# Patient Record
Sex: Female | Born: 1960 | Race: Black or African American | Hispanic: No | Marital: Single | Smoking: Former smoker
Health system: Southern US, Community
[De-identification: ages and names within clinical notes are randomized; demographics above are authoritative.]

## PROBLEM LIST (undated history)

## (undated) DIAGNOSIS — H409 Unspecified glaucoma: Secondary | ICD-10-CM

## (undated) HISTORY — PX: COLONOSCOPY: SHX174

## (undated) HISTORY — DX: Unspecified glaucoma: H40.9

## (undated) HISTORY — PX: POLYPECTOMY: SHX149

---

## 1998-01-31 ENCOUNTER — Other Ambulatory Visit: Admission: RE | Admit: 1998-01-31 | Discharge: 1998-01-31 | Payer: Self-pay | Admitting: Obstetrics and Gynecology

## 1998-01-31 ENCOUNTER — Ambulatory Visit (HOSPITAL_COMMUNITY): Admission: RE | Admit: 1998-01-31 | Discharge: 1998-01-31 | Payer: Self-pay | Admitting: Dermatology

## 1998-04-01 ENCOUNTER — Ambulatory Visit (HOSPITAL_COMMUNITY): Admission: RE | Admit: 1998-04-01 | Discharge: 1998-04-01 | Payer: Self-pay | Admitting: Obstetrics and Gynecology

## 1999-04-14 ENCOUNTER — Other Ambulatory Visit: Admission: RE | Admit: 1999-04-14 | Discharge: 1999-04-14 | Payer: Self-pay | Admitting: Obstetrics and Gynecology

## 2000-05-28 ENCOUNTER — Other Ambulatory Visit: Admission: RE | Admit: 2000-05-28 | Discharge: 2000-05-28 | Payer: Self-pay | Admitting: Obstetrics and Gynecology

## 2001-04-22 ENCOUNTER — Encounter: Admission: RE | Admit: 2001-04-22 | Discharge: 2001-04-22 | Payer: Self-pay | Admitting: Gastroenterology

## 2001-04-22 ENCOUNTER — Encounter: Payer: Self-pay | Admitting: Gastroenterology

## 2001-05-10 ENCOUNTER — Emergency Department (HOSPITAL_COMMUNITY): Admission: EM | Admit: 2001-05-10 | Discharge: 2001-05-11 | Payer: Self-pay

## 2001-05-11 ENCOUNTER — Encounter: Payer: Self-pay | Admitting: Emergency Medicine

## 2001-05-26 ENCOUNTER — Other Ambulatory Visit: Admission: RE | Admit: 2001-05-26 | Discharge: 2001-05-26 | Payer: Self-pay | Admitting: Obstetrics and Gynecology

## 2001-07-01 ENCOUNTER — Encounter: Payer: Self-pay | Admitting: Pulmonary Disease

## 2001-07-01 ENCOUNTER — Ambulatory Visit: Admission: RE | Admit: 2001-07-01 | Discharge: 2001-07-01 | Payer: Self-pay | Admitting: Pulmonary Disease

## 2002-01-30 ENCOUNTER — Encounter: Admission: RE | Admit: 2002-01-30 | Discharge: 2002-01-30 | Payer: Self-pay | Admitting: Gastroenterology

## 2002-01-30 ENCOUNTER — Encounter: Payer: Self-pay | Admitting: Gastroenterology

## 2002-07-06 ENCOUNTER — Encounter: Admission: RE | Admit: 2002-07-06 | Discharge: 2002-07-06 | Payer: Self-pay | Admitting: Internal Medicine

## 2002-07-06 ENCOUNTER — Encounter: Payer: Self-pay | Admitting: Obstetrics and Gynecology

## 2003-06-09 ENCOUNTER — Encounter: Admission: RE | Admit: 2003-06-09 | Discharge: 2003-06-09 | Payer: Self-pay | Admitting: Obstetrics and Gynecology

## 2003-06-09 ENCOUNTER — Encounter: Payer: Self-pay | Admitting: Obstetrics and Gynecology

## 2004-08-21 ENCOUNTER — Ambulatory Visit (HOSPITAL_COMMUNITY): Admission: RE | Admit: 2004-08-21 | Discharge: 2004-08-21 | Payer: Self-pay | Admitting: Obstetrics and Gynecology

## 2005-09-07 ENCOUNTER — Encounter: Payer: Self-pay | Admitting: Family Medicine

## 2005-09-07 LAB — CONVERTED CEMR LAB: Pap Smear: NORMAL

## 2005-11-08 HISTORY — PX: GANGLION CYST EXCISION: SHX1691

## 2005-11-21 ENCOUNTER — Ambulatory Visit (HOSPITAL_COMMUNITY): Admission: RE | Admit: 2005-11-21 | Discharge: 2005-11-21 | Payer: Self-pay | Admitting: Obstetrics and Gynecology

## 2006-08-02 ENCOUNTER — Ambulatory Visit: Payer: Self-pay | Admitting: Family Medicine

## 2006-08-03 ENCOUNTER — Emergency Department (HOSPITAL_COMMUNITY): Admission: EM | Admit: 2006-08-03 | Discharge: 2006-08-03 | Payer: Self-pay | Admitting: Emergency Medicine

## 2006-08-12 ENCOUNTER — Encounter
Admission: RE | Admit: 2006-08-12 | Discharge: 2006-08-12 | Payer: Self-pay | Admitting: Physical Medicine and Rehabilitation

## 2006-08-12 IMAGING — US US ABDOMEN LIMITED
1 series · 14 of 14 positions shown · non-contrast
Comparison: none

CLINICAL DATA: Follow-up liver lesion MRI [REDACTED] [DATE] and [REDACTED] ultrasound [DATE] and [DATE].
LIMITED ABDOMINAL ULTRASOUND:

[Series 1: unknown · 0.23mm/px · 14 of 14 slices shown]
[im 1/14]
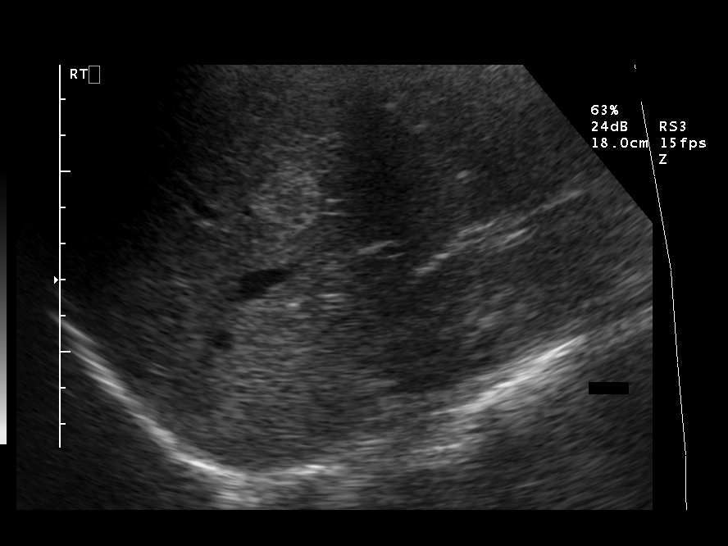
[im 2/14]
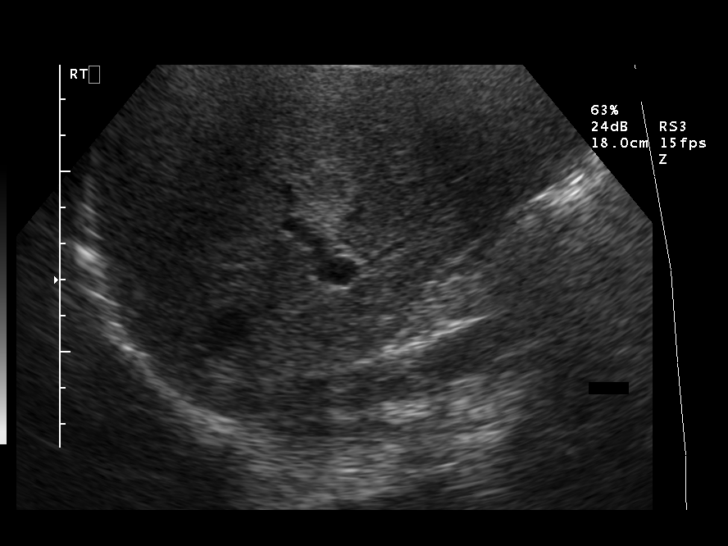
[im 3/14]
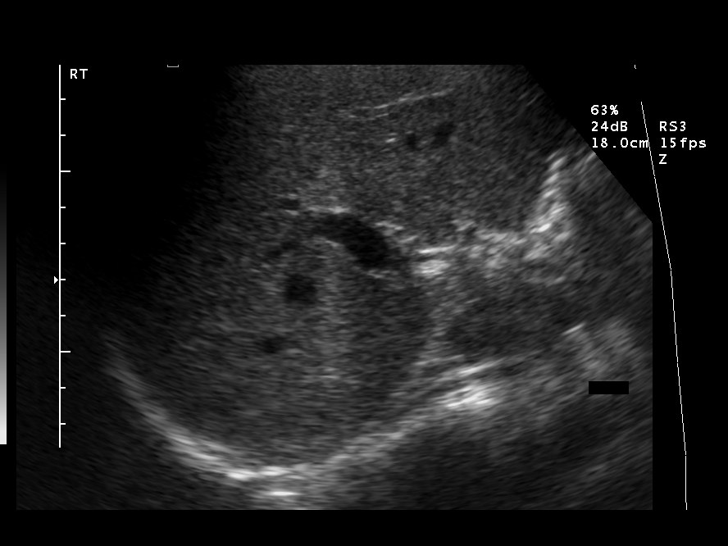
[im 4/14]
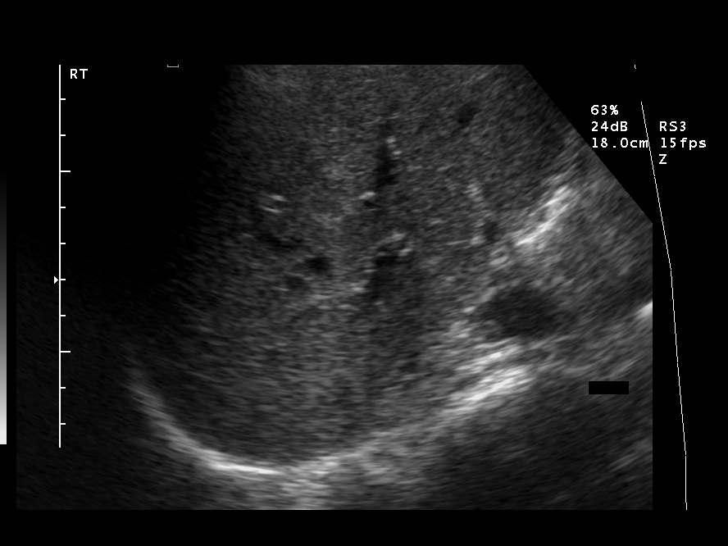
[im 5/14]
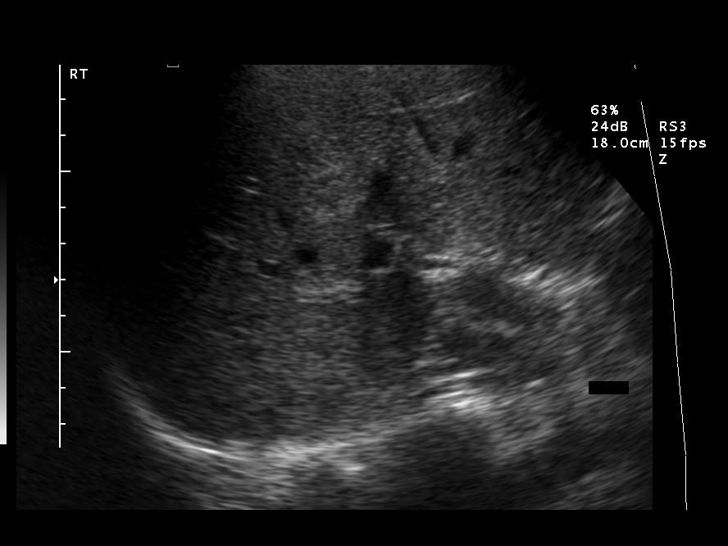
[im 6/14]
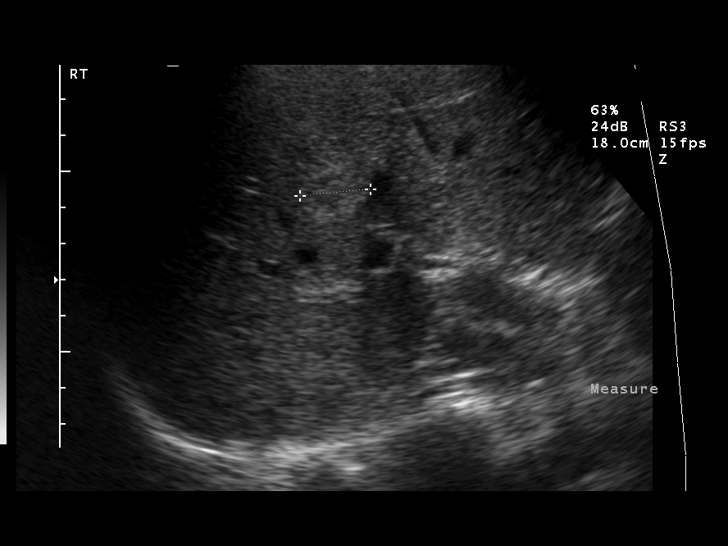
[im 7/14]
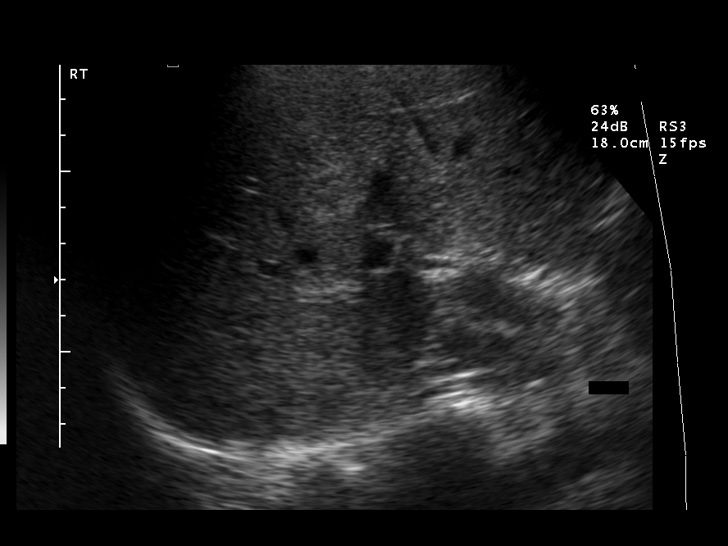
[im 8/14]
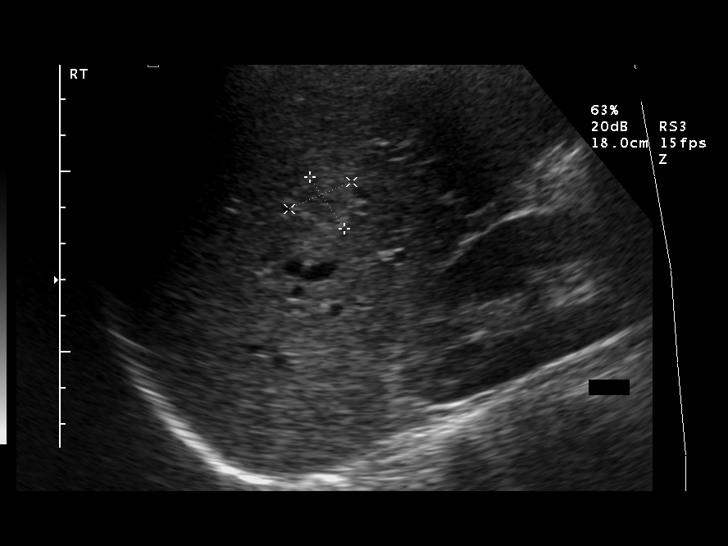
[im 9/14]
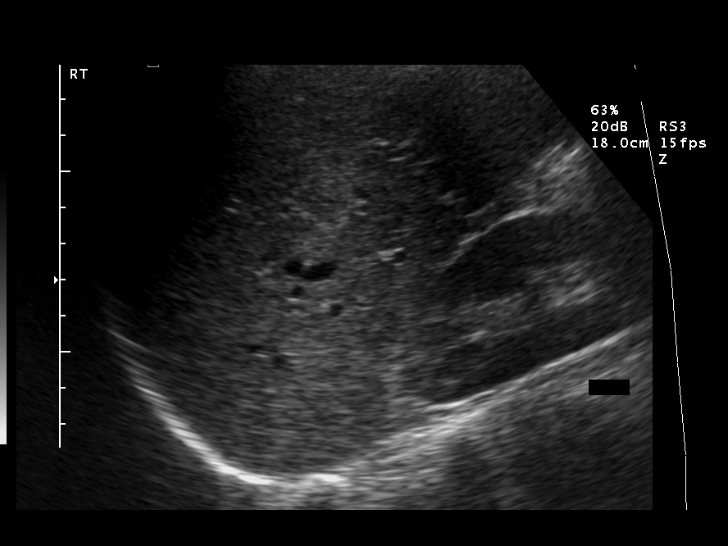
[im 10/14]
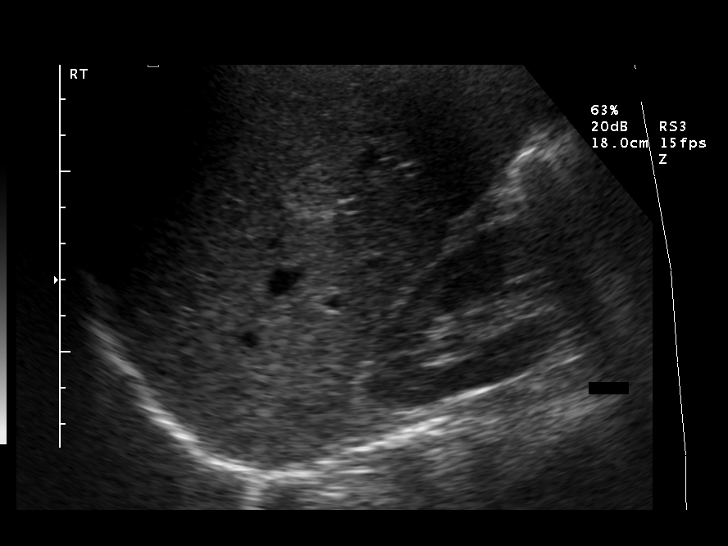
[im 11/14]
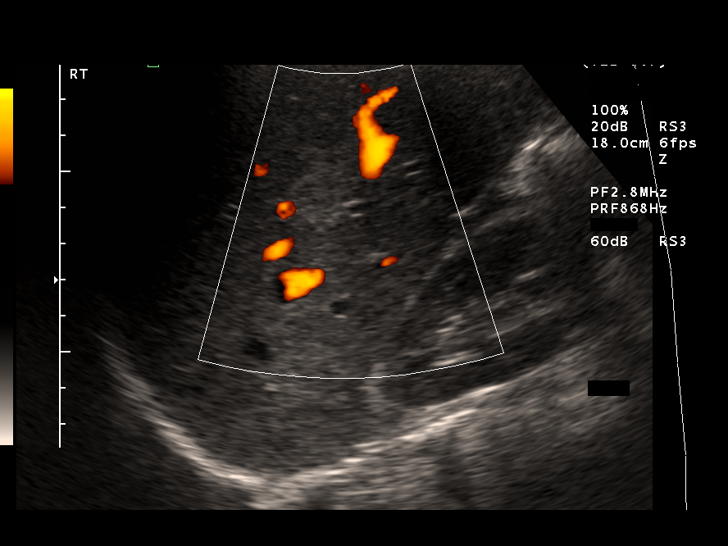
[im 12/14]
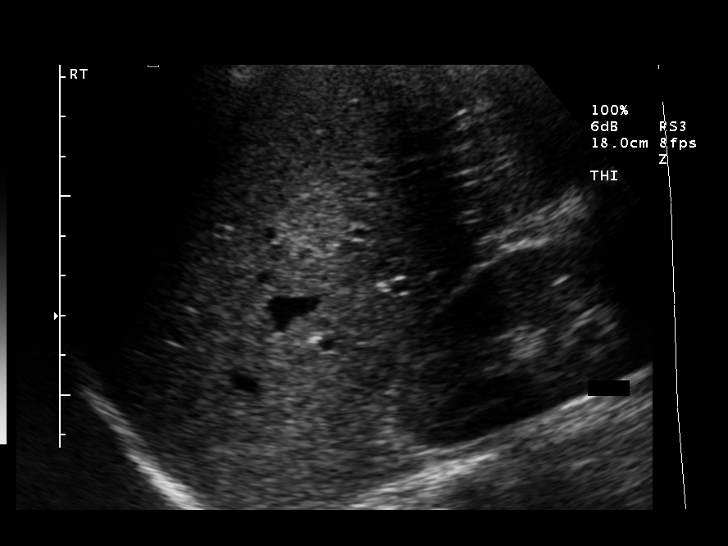
[im 13/14]
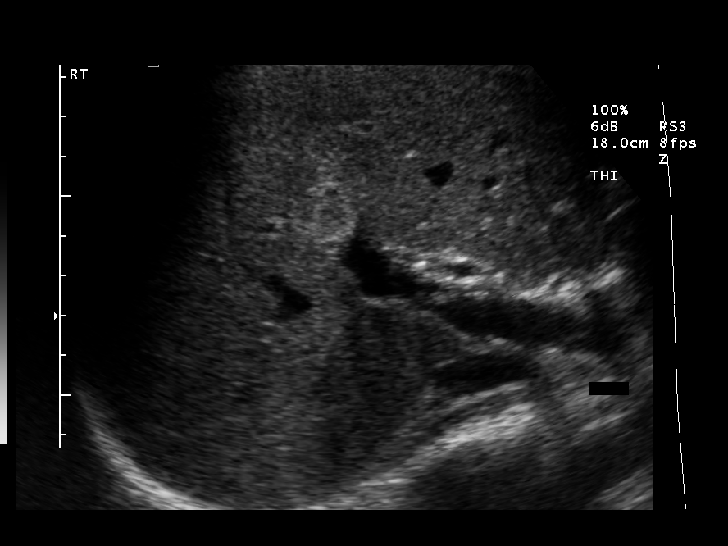
[im 14/14]
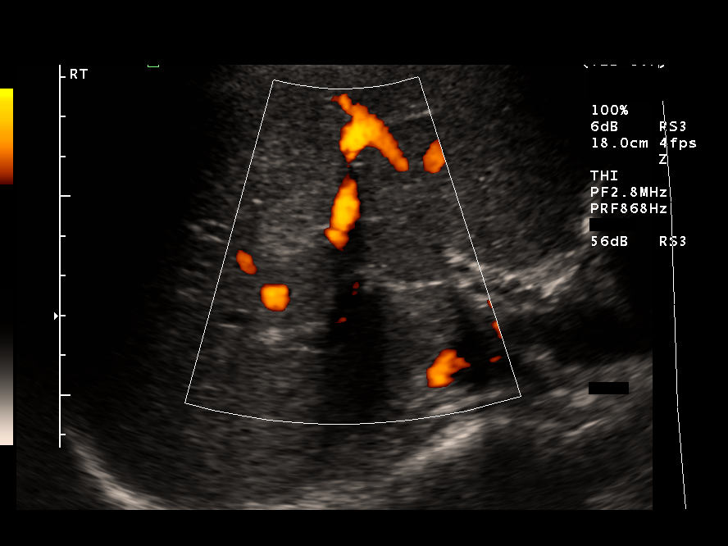

[14 of 14 positions shown; findings below may reference images not displayed]

FINDINGS: Targeted liver ultrasound is compared with previous [REDACTED] abdominal ultrasound reports of [DATE] and [DATE].  No significant change in solitary slightly hyperechoic marginated right hepatic lesion consistent with hemangioma, currently measuring 1.7 cm long by 1.9 cm AP by 2 cm wide (previously reported [DATE] 1.4 x 1.4 x 1.1 cm and [DATE] 1.6 x 1.4 x 1.2 cm).  No new focal solid liver lesions are seen.
IMPRESSION: 1.  Essentially stable solitary lesion consistent with hemangioma.
2.  Otherwise, sonographically normal liver.

## 2006-08-22 ENCOUNTER — Ambulatory Visit: Payer: Self-pay | Admitting: Family Medicine

## 2006-08-28 ENCOUNTER — Ambulatory Visit: Payer: Self-pay | Admitting: Cardiology

## 2006-09-10 ENCOUNTER — Ambulatory Visit: Payer: Self-pay | Admitting: Gastroenterology

## 2006-09-25 ENCOUNTER — Encounter (INDEPENDENT_AMBULATORY_CARE_PROVIDER_SITE_OTHER): Payer: Self-pay | Admitting: Specialist

## 2006-09-25 ENCOUNTER — Ambulatory Visit: Payer: Self-pay | Admitting: Gastroenterology

## 2007-03-17 ENCOUNTER — Encounter: Admission: RE | Admit: 2007-03-17 | Discharge: 2007-03-17 | Payer: Self-pay | Admitting: Obstetrics and Gynecology

## 2007-10-23 ENCOUNTER — Ambulatory Visit: Payer: Self-pay | Admitting: Family Medicine

## 2007-10-24 ENCOUNTER — Encounter: Payer: Self-pay | Admitting: Family Medicine

## 2007-10-24 DIAGNOSIS — F17200 Nicotine dependence, unspecified, uncomplicated: Secondary | ICD-10-CM

## 2007-10-24 DIAGNOSIS — K219 Gastro-esophageal reflux disease without esophagitis: Secondary | ICD-10-CM

## 2007-10-24 DIAGNOSIS — E785 Hyperlipidemia, unspecified: Secondary | ICD-10-CM | POA: Insufficient documentation

## 2007-10-24 HISTORY — DX: Hyperlipidemia, unspecified: E78.5

## 2007-10-29 ENCOUNTER — Telehealth: Payer: Self-pay | Admitting: Family Medicine

## 2007-11-05 ENCOUNTER — Ambulatory Visit: Payer: Self-pay | Admitting: Internal Medicine

## 2007-11-05 LAB — CONVERTED CEMR LAB
Blood in Urine, dipstick: NEGATIVE
Glucose, Urine, Semiquant: NEGATIVE
Ketones, urine, test strip: NEGATIVE
Specific Gravity, Urine: <1.005

## 2007-11-07 LAB — CONVERTED CEMR LAB
CO2: 26 meq/L (ref 19–32)
Calcium: 9.3 mg/dL (ref 8.4–10.5)
Chloride: 107 meq/L (ref 96–112)
Eosinophils Absolute: 0.1 10*3/uL (ref 0.0–0.6)
GFR calc Af Amer: 87 mL/min
GFR calc non Af Amer: 72 mL/min
Glucose, Bld: 100 mg/dL — ABNORMAL HIGH (ref 70–99)
Hemoglobin: 11.6 g/dL — ABNORMAL LOW (ref 12.0–15.0)
Lymphocytes Relative: 33.3 % (ref 12.0–46.0)
Monocytes Relative: 3 % (ref 3.0–11.0)
Neutrophils Relative %: 61.6 % (ref 43.0–77.0)
RDW: 11.9 % (ref 11.5–14.6)
Sodium: 139 meq/L (ref 135–145)
TSH: 1.27 microintl units/mL (ref 0.35–5.50)
WBC: 6.7 10*3/uL (ref 4.5–10.5)

## 2008-01-29 ENCOUNTER — Ambulatory Visit: Payer: Self-pay | Admitting: Family Medicine

## 2008-04-30 ENCOUNTER — Ambulatory Visit: Payer: Self-pay | Admitting: Family Medicine

## 2008-04-30 LAB — CONVERTED CEMR LAB

## 2008-05-03 ENCOUNTER — Telehealth: Payer: Self-pay | Admitting: Family Medicine

## 2008-05-03 LAB — CONVERTED CEMR LAB
ALT: 15 units/L (ref 0–35)
AST: 17 units/L (ref 0–37)
Albumin: 4.1 g/dL (ref 3.5–5.2)
Basophils Relative: 0.9 % (ref 0.0–3.0)
CO2: 25 meq/L (ref 19–32)
Calcium: 9.3 mg/dL (ref 8.4–10.5)
Eosinophils Relative: 2.1 % (ref 0.0–5.0)
Folate: 11.1 ng/mL
GFR calc Af Amer: 99 mL/min
HCT: 36.5 % (ref 36.0–46.0)
Lymphocytes Relative: 33.4 % (ref 12.0–46.0)
Monocytes Absolute: 0.3 10*3/uL (ref 0.1–1.0)
Monocytes Relative: 5.4 % (ref 3.0–12.0)
RBC: 4.01 M/uL (ref 3.87–5.11)
RDW: 12.7 % (ref 11.5–14.6)
Total Bilirubin: 0.8 mg/dL (ref 0.3–1.2)
Vitamin B-12: 191 pg/mL — ABNORMAL LOW (ref 211–911)
WBC: 5.7 10*3/uL (ref 4.5–10.5)

## 2008-05-05 ENCOUNTER — Ambulatory Visit (HOSPITAL_COMMUNITY): Admission: RE | Admit: 2008-05-05 | Discharge: 2008-05-05 | Payer: Self-pay | Admitting: Obstetrics and Gynecology

## 2008-05-05 ENCOUNTER — Ambulatory Visit: Payer: Self-pay | Admitting: Family Medicine

## 2008-05-23 ENCOUNTER — Emergency Department: Payer: Self-pay | Admitting: Emergency Medicine

## 2008-06-04 ENCOUNTER — Ambulatory Visit: Payer: Self-pay | Admitting: Family Medicine

## 2008-06-07 ENCOUNTER — Encounter: Payer: Self-pay | Admitting: Family Medicine

## 2008-08-10 ENCOUNTER — Ambulatory Visit: Payer: Self-pay | Admitting: Family Medicine

## 2008-08-11 ENCOUNTER — Encounter: Payer: Self-pay | Admitting: Family Medicine

## 2008-08-13 ENCOUNTER — Telehealth: Payer: Self-pay | Admitting: Family Medicine

## 2008-09-16 ENCOUNTER — Encounter: Payer: Self-pay | Admitting: Family Medicine

## 2008-10-08 ENCOUNTER — Encounter: Payer: Self-pay | Admitting: Family Medicine

## 2009-06-07 ENCOUNTER — Ambulatory Visit (HOSPITAL_COMMUNITY): Admission: RE | Admit: 2009-06-07 | Discharge: 2009-06-07 | Payer: Self-pay | Admitting: Obstetrics and Gynecology

## 2009-06-09 ENCOUNTER — Encounter (INDEPENDENT_AMBULATORY_CARE_PROVIDER_SITE_OTHER): Payer: Self-pay | Admitting: *Deleted

## 2009-08-08 ENCOUNTER — Encounter: Payer: Self-pay | Admitting: Family Medicine

## 2009-08-08 LAB — CONVERTED CEMR LAB: Pap Smear: NORMAL

## 2009-08-08 LAB — HM PAP SMEAR

## 2010-06-16 ENCOUNTER — Ambulatory Visit (HOSPITAL_COMMUNITY): Admission: RE | Admit: 2010-06-16 | Discharge: 2010-06-16 | Payer: Self-pay | Admitting: Obstetrics and Gynecology

## 2010-07-26 ENCOUNTER — Telehealth: Payer: Self-pay | Admitting: Family Medicine

## 2010-10-30 ENCOUNTER — Ambulatory Visit
Admission: RE | Admit: 2010-10-30 | Discharge: 2010-10-30 | Payer: Self-pay | Source: Home / Self Care | Attending: Family Medicine | Admitting: Family Medicine

## 2010-10-30 ENCOUNTER — Encounter (INDEPENDENT_AMBULATORY_CARE_PROVIDER_SITE_OTHER): Payer: Self-pay | Admitting: *Deleted

## 2010-11-07 NOTE — Progress Notes (Signed)
Summary: BP elevated  Phone Note Call from Patient Call back at [phone]   Caller: Patient Summary of Call: Pt states her BP was 145/85 and 140/85 yesterday and she says it has never been that high, asks if she should be concerned.  She has a tension pain in the back of her head.  She thinks this is due to stress.  She is under a lot of stress at work.   Any suggestions? Initial call taken by: Lowella Petties CMA,  July 26, 2010 9:03 AM  Follow-up for Phone Call        Work on low salt diet, stress reduaion and relaxation. Follow BPs at home..if 3 measurements at different time above 140/90.Marland Kitchenmake appt to discuss new diagnosis HTN.  Follow-up by: Kerby Nora MD,  July 26, 2010 12:46 PM  Additional Follow-up for Phone Call Additional follow up Details #1::        Patient advised.Consuello Masse CMA   Additional Follow-up by: Benny Lennert CMA Duncan Dull),  July 26, 2010 1:23 PM

## 2010-11-09 NOTE — Letter (Signed)
Summary: Out of Work  [employer] at [facility]  [address] [place], [place] [address]   Phone: [phone]  Fax: [phone]    October 30, 2010   Employee:  Maedell E Mittag    To Whom It May Concern:   For Medical reasons, please excuse the above named employee from work for the following dates:  Start:  October 30, 2010 12:22 PM   End:   May return January 24th, 2012  If you need additional information, please feel free to contact our office.         Sincerely,    Hannah Beat MD

## 2010-11-09 NOTE — Assessment & Plan Note (Signed)
Summary: COUGH,CONGESTION/CLE   Vital Signs:  Patient profile:   50 year old female Height:      65 inches Weight:      154.50 pounds BMI:     25.80 Temp:     97.8 degrees F oral Pulse rate:   80 / minute Pulse rhythm:   regular BP sitting:   104 / 70  (left arm) Cuff size:   regular  Vitals Entered By: Benny Lennert CMA Duncan Dull) (October 30, 2010 12:04 PM)  History of Present Illness: Chief complaint cough and  congestion  50 year old female:  Came home wednesday night, hit like her head was full of congestion. Mucinex and zyrtec d When the medicine comes out, a lot of congestion and coughing.  mild drainage no earache eating and drinking normally.  Smokes a few cigarettes a day.    REVIEW OF SYSTEMS GEN: Acute illness details above. CV: No chest pain or SOB GI: No noted N or V Otherwise, pertinent positives and negatives are noted in the HPI.   GEN: WDWN, NAD; alert,appropriate and cooperative throughout exam HEENT: Normocephalic and atraumatic. Throat clear, w/o exudate, no LAD, R TM clear, L TM - good landmarks, No fluid present. rhinnorhea.  Left frontal and maxillary sinuses: NT Right frontal and maxillary sinuses: NT NECK: No ant or post LAD CV: RRR, No M/G/R PULM: no resp distress, no accessory muscles.  No retractions. no w/c/r ABD: S,NT,ND,+BS, No HSM EXTR: no c/c/e PSYCH: full affect, pleasant, conversant   Allergies (verified): No Known Drug Allergies   Impression & Recommendations:  Problem # 1:  URI (ICD-465.9) supportive care albuterol as needed - light cigarette use  Complete Medication List: 1)  Lo/ovral (28) 0.3-30 Mg-mcg Tabs (Norgestrel-ethinyl estradiol) .... Take 1 tablet by mouth once a day 2)  Ventolin Hfa 108 (90 Base) Mcg/act Aers (Albuterol sulfate) .... 2 puffs q 4 hours as needed cough Prescriptions: VENTOLIN HFA 108 (90 BASE) MCG/ACT AERS (ALBUTEROL SULFATE) 2 puffs q 4 hours as needed cough  #1 x 0   Entered and Authorized  by:   Hannah Beat MD   Signed by:   Hannah Beat MD on 10/30/2010   Method used:   [facility]        [facility]       [address]       [place], [place]  [address]       Ph: [phone] or [phone]       Fax: [phone]   RxID:   [id number]    Orders Added: 1)  Est. Patient Level III [38756]    Current Allergies (reviewed today): No known allergies

## 2010-11-20 ENCOUNTER — Telehealth (INDEPENDENT_AMBULATORY_CARE_PROVIDER_SITE_OTHER): Payer: Self-pay | Admitting: *Deleted

## 2010-11-20 ENCOUNTER — Encounter (INDEPENDENT_AMBULATORY_CARE_PROVIDER_SITE_OTHER): Payer: Self-pay | Admitting: *Deleted

## 2010-11-20 ENCOUNTER — Other Ambulatory Visit: Payer: Self-pay | Admitting: Family Medicine

## 2010-11-20 ENCOUNTER — Other Ambulatory Visit (INDEPENDENT_AMBULATORY_CARE_PROVIDER_SITE_OTHER): Payer: Commercial Managed Care - PPO

## 2010-11-20 DIAGNOSIS — E785 Hyperlipidemia, unspecified: Secondary | ICD-10-CM

## 2010-11-20 LAB — HEPATIC FUNCTION PANEL
Albumin: 4.1 g/dL (ref 3.5–5.2)
Alkaline Phosphatase: 53 U/L (ref 39–117)
Total Bilirubin: 0.7 mg/dL (ref 0.3–1.2)
Total Protein: 7.3 g/dL (ref 6.0–8.3)

## 2010-11-20 LAB — BASIC METABOLIC PANEL
CO2: 24 mEq/L (ref 19–32)
Calcium: 9.2 mg/dL (ref 8.4–10.5)
Creatinine, Ser: 0.7 mg/dL (ref 0.4–1.2)
GFR: 108.94 mL/min (ref >60.00)
Glucose, Bld: 83 mg/dL (ref 70–99)
Potassium: 4.1 mEq/L (ref 3.5–5.1)
Sodium: 139 mEq/L (ref 135–145)

## 2010-11-20 LAB — LIPID PANEL: Cholesterol: 215 mg/dL — ABNORMAL HIGH (ref 0–200)

## 2010-11-23 ENCOUNTER — Encounter: Payer: Self-pay | Admitting: Family Medicine

## 2010-11-23 ENCOUNTER — Encounter (INDEPENDENT_AMBULATORY_CARE_PROVIDER_SITE_OTHER): Payer: Commercial Managed Care - PPO | Admitting: Family Medicine

## 2010-11-23 DIAGNOSIS — J4520 Mild intermittent asthma, uncomplicated: Secondary | ICD-10-CM

## 2010-11-23 DIAGNOSIS — E785 Hyperlipidemia, unspecified: Secondary | ICD-10-CM

## 2010-11-23 DIAGNOSIS — K219 Gastro-esophageal reflux disease without esophagitis: Secondary | ICD-10-CM

## 2010-11-23 DIAGNOSIS — Z23 Encounter for immunization: Secondary | ICD-10-CM

## 2010-11-23 DIAGNOSIS — F17200 Nicotine dependence, unspecified, uncomplicated: Secondary | ICD-10-CM

## 2010-11-23 DIAGNOSIS — J45909 Unspecified asthma, uncomplicated: Secondary | ICD-10-CM

## 2010-11-23 HISTORY — DX: Mild intermittent asthma, uncomplicated: J45.20

## 2010-11-29 NOTE — Progress Notes (Signed)
----   Converted from flag ---- ---- 11/20/2010 8:25 AM, Kerby Nora MD wrote: Dx 272.0 CMEt, lipids   ---- 11/17/2010 10:36 AM, Liane Comber CMA (AAMA) wrote: Lab orders please! Good Morning! This pt is scheduled for cpx labs Monday, which labs to draw and dx codes to use? Thanks Tasha ------------------------------

## 2010-11-29 NOTE — Assessment & Plan Note (Signed)
Summary: cpx/alc   Vital Signs:  Patient profile:   50 year old female Height:      65 inches Weight:      149 pounds BMI:     24.88 Temp:     98.6 degrees F oral Pulse rate:   80 / minute Pulse rhythm:   regular BP sitting:   120 / 70  (left arm) Cuff size:   regular  Vitals Entered By: Benny Lennert CMA Duncan Dull) (November 23, 2010 9:16 AM)  History of Present Illness: Chief complaint  Follow up.   CPX done at GYN.   I have not seen her since 2009.Marland Kitchen Acute visit in 2012 with [facility].    Asthma, well controlled on no med. Last problem with this years ago.  GERD, well controlle don no medication.   High cholesterol.. fairly good diet.. some fried foods. No exercsie.  Goal LDL <130  Smoking... contemplative for smoking cessation. Smoking 1 pack in 5 days... closet smoker.   Problems Prior to Update: 1)  Pharyngitis, Acute  (ICD-462) 2)  Memory Loss  (ICD-780.93) 3)  Sinusitis- Acute-nos  (ICD-461.9) 4)  Malaise and Fatigue  (ICD-780.79) 5)  Tobacco Abuse  (ICD-305.1) 6)  Hyperlipidemia  (ICD-272.4) 7)  Gerd  (ICD-530.81) 8)  Asthma  (ICD-493.90) 9)  Uri  (ICD-465.9)  Current Medications (verified): 1)  Lo/ovral (28) 0.3-30 Mg-Mcg Tabs (Norgestrel-Ethinyl Estradiol) .... Take 1 Tablet By Mouth Once A Day  Allergies (verified): No Known Drug Allergies  Past History:  Past medical, surgical, family and social histories (including risk factors) reviewed, and no changes noted (except as noted below).  Past Medical History: Reviewed history from 10/24/2007 and no changes required. Asthma GERD Hyperlipidemia  Past Surgical History: Reviewed history from 10/24/2007 and no changes required. 11/2005    Ganglion cyst, right  Family History: Reviewed history from 10/24/2007 and no changes required. Father: Died 63 CAD, COPD Mother: Died 14, lung CA, colon CA, HTN Siblings: 1 brother, 2 sisters CVA age 74, seizures after procedure at age 22, CAD  Social  History: Reviewed history from 10/24/2007 and no changes required. Current Smoker, 3/day, using gum 2-3/week, not working Alcohol use-none Drug use-no Regular exercise-yes, sporadic Marital Status: Divorced, in a relationship, (+) sex, no protection Children: 1 son, healthy Occupation: [employer] Diet:  Skips  breakfast, salads  Review of Systems General:  Denies fatigue and fever. CV:  Denies chest pain or discomfort. Resp:  Denies shortness of breath. GI:  Denies abdominal pain, bloody stools, constipation, and diarrhea. GU:  Denies dysuria.  Physical Exam  General:  Well-developed,well-nourished,in no acute distress; alert,appropriate and cooperative throughout examination Ears:  External ear exam shows no significant lesions or deformities.  Otoscopic examination reveals clear canals, tympanic membranes are intact bilaterally without bulging, retraction, inflammation or discharge. Hearing is grossly normal bilaterally. Nose:  External nasal examination shows no deformity or inflammation. Nasal mucosa are pink and moist without lesions or exudates. Mouth:  Oral mucosa and oropharynx without lesions or exudates.  Teeth in good repair. Neck:  no carotid bruit or thyromegaly no cervical or supraclavicular lymphadenopathy  Breasts:  per GYN  Lungs:  Normal respiratory effort, chest expands symmetrically. Lungs are clear to auscultation, no crackles or wheezes. Heart:  Normal rate and regular rhythm. S1 and S2 normal without gallop, murmur, click, rub or other extra sounds. Abdomen:  Bowel sounds positive,abdomen soft and non-tender without masses, organomegaly or hernias noted. Genitalia:  per GYN  Pulses:  R and L posterior  tibial pulses are full and equal bilaterally  Extremities:  no edema  Skin:  Intact without suspicious lesions or rashes Psych:  Cognition and judgment appear intact. Alert and cooperative with normal attention span and concentration. No apparent delusions,  illusions, hallucinations   Impression & Recommendations:  Problem # 1:  HYPERLIPIDEMIA (ICD-272.4)  Work on low cholesterol diet. Increase exercise.   Labs Reviewed: SGOT: 17 (11/20/2010)   SGPT: 11 (11/20/2010)   HDL:42.60 (11/20/2010)  Chol:215 (11/20/2010)  Trig:150.0 (11/20/2010)  Problem # 2:  ASTHMA (ICD-493.90) stable on no medication.  The following medications were removed from the medication list:    Ventolin Hfa 108 (90 Base) Mcg/act Aers (Albuterol sulfate) .Marland Kitchen... 2 puffs q 4 hours as needed cough  Problem # 3:  ASTHMA, INTERMITTENT, MILD (ICD-493.90) Stable.. on no medicaiton. No exacerbations in  last few years.   The following medications were removed from the medication list:    Ventolin Hfa 108 (90 Base) Mcg/act Aers (Albuterol sulfate) .Marland Kitchen... 2 puffs q 4 hours as needed cough  Problem # 4:  TOBACCO ABUSE (ICD-305.1)  Orders: Tobacco use cessation intermediate 3-10 minutes ([address])  Problem # 5:  Preventive Health Care (ICD-V70.0) Assessment: Comment Only Reviewed preventive care protocols, scheduled due services, and updated immunizations.    Colonmoscopy due tin 2014  given mothers hx of colon cancer age 85.   Complete Medication List: 1)  Lo/ovral (28) 0.3-30 Mg-mcg Tabs (Norgestrel-ethinyl estradiol) .... Take 1 tablet by mouth once a day 2)  Chantix Starting Month Pak 0.5 Mg X 11 & 1 Mg X 42 Tabs (Varenicline tartrate) .... As directd 3)  Chantix Continuing Month Pak 1 Mg Tabs (Varenicline tartrate) .Marland Kitchen.. 1 tab by mouth two times a day  Other Orders: Tdap => 3yrs IM (60454) Admin 1st Vaccine (09811)  Patient Instructions: 1)  Work on low cholesterol diet. Increase exercise.  2)   Recheck fasting LIPIDS  in 3 months Dx 272.0    3)  Please schedule a follow-up appointment in 1 year.  Prescriptions: CHANTIX CONTINUING MONTH PAK 1 MG TABS (VARENICLINE TARTRATE) 1 tab by mouth two times a day  #1 pack x 5   Entered and Authorized by:   Kerby Nora MD    Signed by:   Kerby Nora MD on 11/23/2010   Method used:   Print then Give to Patient   RxID:   [id number] CHANTIX STARTING MONTH PAK 0.5 MG X 11 & 1 MG X 42 TABS (VARENICLINE TARTRATE) as directd  #1 pack x 0   Entered and Authorized by:   Kerby Nora MD   Signed by:   Kerby Nora MD on 11/23/2010   Method used:   Print then Give to Patient   RxID:   [id number] CHANTIX CONTINUING MONTH PAK 1 MG TABS (VARENICLINE TARTRATE) 1 tab by mouth two times a day  #1 pack x 5   Entered and Authorized by:   Kerby Nora MD   Signed by:   Kerby Nora MD on 11/23/2010   Method used:   [facility]        [facility] ([facility]       [address]       [place], [place]  [address]       Ph: [phone] or [phone]       Fax: [phone]   RxID:   [id number] CHANTIX STARTING MONTH PAK 0.5 MG X 11 & 1 MG X 42 TABS (VARENICLINE TARTRATE) as directd  #  1 pack x 0   Entered and Authorized by:   Kerby Nora MD   Signed by:   Kerby Nora MD on 11/23/2010   Method used:   [facility]        [id number]       [address]       [place], [place]  [address]       Ph: [phone] or [phone]       Fax: [phone]   RxID:   [id number]    Orders Added: 1)  Tdap => 1yrs IM [90715] 2)  Admin 1st Vaccine [90471] 3)  Est. Patient Level IV [36644] 4)  Tobacco use cessation intermediate 3-10 minutes [99406]   Immunizations Administered:  Tetanus Vaccine:    Vaccine Type: Tdap   Immunizations Administered:  Tetanus Vaccine:    Vaccine Type: Tdap  Current Allergies (reviewed today): No known allergies   Last Flu Vaccine:  Fluvax 3+ (08/08/2006 9:00:50 AM) Flu Vaccine Next Due:  Refused Last PAP:  Normal (09/07/2005 9:00:20 AM) PAP Result Date:  08/08/2009 PAP Result:  normal PAP Next Due:  1 yr Last Mammogram:  ASSESSMENT: Negative - BI-RADS 1^MM DIGITAL SCREENING (06/07/2009 11:40:00 AM) Mammogram Result  Date:  08/08/2009 Mammogram Result:  normal Mammogram Next Due:  1 yr mother with colon cancer age 73.. last colonoscop...polyps in 2009  Appended Document: cpx/alc    Clinical Lists Changes  Orders: Added new Service order of Tdap => 53yrs IM (03474) - Signed Added new Service order of Admin 1st Vaccine (25956) - Signed Observations: Added new observation of TD BOOST VIS: 08/25/08 version given November 23, 2010. (11/23/2010 10:50) Added new observation of TD BOOSTERLO: [id number] (11/23/2010 10:50) Added new observation of TD BOOST EXP: 07/27/2012 (11/23/2010 10:50) Added new observation of TD BOOSTERBY: Heather Woodard CMA (AAMA) (11/23/2010 10:50) Added new observation of TD BOOSTERRT: IM (11/23/2010 10:50) Added new observation of TDBOOSTERDSE: 0.5 ml (11/23/2010 10:50) Added new observation of TD BOOSTERMF: GlaxoSmithKline (11/23/2010 10:50) Added new observation of TD BOOST SIT: right deltoid (11/23/2010 10:50) Added new observation of TD BOOSTER: Tdap (11/23/2010 10:50)       Immunizations Administered:  Tetanus Vaccine:    Vaccine Type: Tdap    Site: right deltoid    Mfr: GlaxoSmithKline    Dose: 0.5 ml    Route: IM    Given by: Benny Lennert CMA (AAMA)    Exp. Date: 07/27/2012    Lot #: [id number]    VIS given: 08/25/08 version given November 23, 2010.

## 2011-02-23 NOTE — Assessment & Plan Note (Signed)
[facility]                             [facility] OFFICE NOTE   NAME:[name], [name] TAGLIAFERRI                    MRN:          [id number]  DATE:08/05/2006                            DOB:          1961-04-04    CHIEF COMPLAINT:  A 50 year old black female here to establish a new doctor.   HISTORY OF PRESENT ILLNESS:  [name] [name] has not seen a doctor in about five  years. She did start having some low-back pain and symptoms that felt like  she had the flu. At that point in time, she was seen at [facility], and this  was all within the last few weeks. At that time, she was diagnosed with a  urinary tract infection and given Levaquin. She states that the flu-like  symptoms and any tenderness with urination has gone, but she does still have  some of the low-back pain although it is a little bit more over the outside  part of her right hip. The right hip increases in pain when she pushes on it  and when she lays on that side directly. She denies fevers, chills,  incontinence, lower extremity weakness or numbness.   In addition, she has been having some occasional chest tightness off and on  over the past 2 months in her central chest. It feels like she has fullness  in her chest. It happens at rest and usually lasts about one to two days.  She denies any sour taste in her mouth and no nausea and diaphoresis along  with it. She denies shortness of breath as well.   REVIEW OF SYSTEMS:  Otherwise negative.   PAST MEDICAL HISTORY:  1. High cholesterol.  2. Asthma, mild intermittent.  3. GERD.  4. Tobacco abuse.   HOSPITALIZATIONS, SURGERIES, PROCEDURES:  1. February 2007, ganglion cyst, right wrist, removal.  2. Mammogram December 2006 negative.  3. Pap smear December 2006 negative.   FAMILY HISTORY:  Father deceased at age 25 with coronary artery disease and  COPD. Mother deceased at age 67 with lung cancer and colon cancer as well as  high blood  pressure. She has one brother and one sister. Her sister had a  stroke at age 31 after a procedure, and her other sister began having  seizures and coronary artery disease at around age 77. Her one brother does  have diabetes. She has breast cancer history in her sister who developed it  at a young age in her 23s.   SOCIAL HISTORY:  She smokes 3 cigarettes per day. She was using nicotine gum  for 2 to 3 weeks, but it was not helping. She drinks 1 to 2 glasses of wine  per week. She does not use any other drugs. She works in Museum/gallery curator. She has  been divorced but is currently in a relationship and is sexually active but  does not use protection. She has one child who is healthy. She gets sporadic  exercise. She skips breakfast but does eat a lot of fruits and vegetables.   PHYSICAL EXAMINATION:  VITAL SIGNS:  Height 5 foot 5 inches,  weight 149,  blood pressure 98/60, pulse 60, temperature 98.1.  GENERAL:  Healthy appearing female in no apparent distress.  HEENT:  PERRLA. Extraocular muscles intact. Oropharynx clear. Tympanic  membranes clear. Nares clear. No thyromegaly. No lymphadenopathy.  MUSCULOSKELETAL:  No tenderness to palpation of the bilateral paraspinous  muscles or vertebra in lumbar spine. She is tender to palpation over the  trochanteric bursa with gentle palpation, negative straight leg, negative  Faber's, strength 5/5 in upper and lower extremities, reflexes 2+. Gait  within normal limits.  PULMONARY:  Clear to auscultation bilaterally. No wheezes, rales or rhonchi.  CARDIOVASCULAR:  Regular rate and rhythm. No murmurs, rubs, or gallops.  Normal PMI. Two plus peripheral pulses. No peripheral edema.  ABDOMEN:  Soft, nontender, normal active bowel sounds. No  hepatosplenomegaly.   PROCEDURES:  Normal sinus rhythm, nonspecific T-wave changes, some sinus  bradycardia. Urinalysis negative.   1. Chest pain, noncardiac. From her clinical history, her chest pain does      not  sound like cardiac pain. EKG was within normal limits. Given her      strong family history for myocardial infarction at an early age, we      will evaluate her further with a stress treadmill test. This will be      scheduled next available.  2. Right hip tenderness. There is most likely trochanteric bursitis. She      was given information on stretching exercises and nonsteroidal anti-      inflammatories used p.r.n. pain as well as ice. She will follow up as      needed if this does not improve.  3. Hypercholesterolemia. She is due for a cholesterol panel as well as a      diabetes screen given her family history. I will have her return for      both these sets of labs.  4. Prevention. She is also due for tetanus and flu vaccine which she would      like to wait on. Because of her family history of first-degree      relatives with cancer in age 75s, I recommend that she have a      colonoscopy done next available. This will be set up for her. She will      follow up after records are obtained from her previous doctor, or as      stated above.     Kerby Nora, MD    AB/MedQ  DD: 08/05/2006  DT: 08/06/2006  Job #: [id number]

## 2011-06-26 ENCOUNTER — Other Ambulatory Visit (HOSPITAL_COMMUNITY): Payer: Self-pay | Admitting: Obstetrics and Gynecology

## 2011-06-26 DIAGNOSIS — Z1231 Encounter for screening mammogram for malignant neoplasm of breast: Secondary | ICD-10-CM

## 2011-07-05 ENCOUNTER — Ambulatory Visit (HOSPITAL_COMMUNITY): Payer: PRIVATE HEALTH INSURANCE | Attending: Obstetrics and Gynecology

## 2011-07-31 ENCOUNTER — Encounter: Payer: Self-pay | Admitting: Family Medicine

## 2011-07-31 ENCOUNTER — Ambulatory Visit (INDEPENDENT_AMBULATORY_CARE_PROVIDER_SITE_OTHER): Payer: PRIVATE HEALTH INSURANCE | Admitting: Family Medicine

## 2011-07-31 VITALS — BP 102/72 | HR 76 | Temp 98.2°F | Ht 65.0 in | Wt 149.8 lb

## 2011-07-31 DIAGNOSIS — F411 Generalized anxiety disorder: Secondary | ICD-10-CM | POA: Insufficient documentation

## 2011-07-31 DIAGNOSIS — F430 Acute stress reaction: Secondary | ICD-10-CM | POA: Insufficient documentation

## 2011-07-31 MED ORDER — ALPRAZOLAM 0.25 MG PO TABS: 0.2500 mg | ORAL_TABLET | Freq: Three times a day (TID) | ORAL | Status: DC | PRN

## 2011-07-31 NOTE — Patient Instructions (Signed)
Great to meet you. Good luck with your test tomorrow. Please do not take any xanax before driving or before your test tomorrow.

## 2011-07-31 NOTE — Progress Notes (Signed)
  Subjective:    Patient ID: [name] [name], female    DOB: February 02, 1961, 50 y.o.   MRN: [id number]  HPI 50 yo pt of Dr. Ermalene Searing here for anxiety. No h/o anxiety or depression.  Has been studying for her Series 7 exam since August. Took it on Sept 23 and failed it by six points. Since then, she has been studying every day for 8-12 hours. She knows that she knows the information, her repeat test is scheduled for tomorrow in [place].  Yesterday, she woke up crying. Tried to study and became very anxious. No CP, dizziness r SOB.  She does report that in college 25 years ago, she had severe testing anxiety as well.   Patient Active Problem List  Diagnoses  . HYPERLIPIDEMIA  . TOBACCO ABUSE  . GERD  . ASTHMA, INTERMITTENT, MILD   Past Medical History  Diagnosis Date  . Asthma   . GERD (gastroesophageal reflux disease)   . Hyperlipidemia    Past Surgical History  Procedure Date  . Ganglion cyst excision 11/2005   History  Substance Use Topics  . Smoking status: Current Everyday Smoker  . Smokeless tobacco: Not on file  . Alcohol Use: No   Family History  Problem Relation Age of Onset  . Cancer Mother     lung, colon  . Hypertension Mother   . Heart disease Father   . COPD Father   . Stroke Sister   . Heart disease Sister   . Stroke Sister   . Heart disease Sister    Allergies not on file No current outpatient prescriptions on file prior to visit.   The PMH, PSH, Social History, Family History, Medications, and allergies have been reviewed in [facility], and have been updated if relevant.    Review of Systems See HPI    No SI or HI. Objective:   Physical Exam BP 102/72  Pulse 76  Temp(Src) 98.2 F (36.8 C) (Oral)  Ht 5\' 5"  (1.651 m)  Wt 149 lb 12 oz (67.926 kg)  BMI 24.92 kg/m2  LMP 07/03/2011  General:  Well-developed,well-nourished,in no acute distress; alert,appropriate and cooperative throughout examination Head:  normocephalic and atraumatic.     Ears:  R ear normal and L ear normal.   Nose:  no external deformity.   Mouth:  good dentition.   Psych:  Cognition and judgment appear intact. Alert and cooperative with normal attention span and concentration. No apparent delusions, illusions, hallucinations. Not anxious or depressed appearing.    Assessment & Plan:   1. Anxiety as acute reaction to exceptional stress    New. >15 min spent with face to face with patient, >50% counseling and/or coordinating care. Since it is the day before her exam, I cautioned her not take any new medications prior to her exam. At this point, she likely will not learn any new information for her test, so I will give rx for Xanax that she can take today to simply relax since she has not been sleeping well. Advised not taking any tonight or tomorrow. See pt instructions for details. The patient indicates understanding of these issues and agrees with the plan.

## 2011-08-17 ENCOUNTER — Ambulatory Visit: Payer: PRIVATE HEALTH INSURANCE | Admitting: Family Medicine

## 2011-09-10 ENCOUNTER — Telehealth: Payer: Self-pay | Admitting: Internal Medicine

## 2011-09-10 NOTE — Telephone Encounter (Signed)
Seen by Dr. Dayton Martes for this .Marland Kitchen Will forward.

## 2011-09-10 NOTE — Telephone Encounter (Signed)
Patient called and stated her appointments isn't being covered b/c it is being coded as mental health.  She states she was here for anxiety for a test she had to take but she isn't mentally ill.  She did pass the test and was wondering if her appointment could be re coded b/c she will have trouble with insurance.  Please advise.

## 2011-09-10 NOTE — Telephone Encounter (Signed)
I did code it as an acute reaction and stated in my note that she does not have a history of anxiety or depression. I'm not sure how else to code it. Will forward to [facility] for her advise.

## 2011-09-11 NOTE — Telephone Encounter (Signed)
Please review and advise. Thanks.  

## 2011-10-04 NOTE — Telephone Encounter (Signed)
Molli Hazard or Bo Merino,  Can you please review the coding for this one?  Is this one that should have been documented/coded differently?  Thanks, [employer]

## 2011-10-29 ENCOUNTER — Encounter: Payer: Self-pay | Admitting: Gastroenterology

## 2011-11-01 ENCOUNTER — Ambulatory Visit
Admission: RE | Admit: 2011-11-01 | Discharge: 2011-11-01 | Disposition: A | Discharge status: Home / Self Care | Payer: PRIVATE HEALTH INSURANCE | Source: Ambulatory Visit | Attending: Obstetrics and Gynecology | Admitting: Obstetrics and Gynecology

## 2011-11-01 DIAGNOSIS — Z1231 Encounter for screening mammogram for malignant neoplasm of breast: Secondary | ICD-10-CM

## 2012-03-20 ENCOUNTER — Telehealth: Payer: Self-pay | Admitting: Family Medicine

## 2012-03-20 DIAGNOSIS — E785 Hyperlipidemia, unspecified: Secondary | ICD-10-CM

## 2012-03-20 NOTE — Telephone Encounter (Signed)
Message copied by Excell Seltzer on Thu Mar 20, 2012 11:58 PM ------      Message from: Alvina Chou      Created: Thu Mar 20, 2012  9:26 AM      Regarding: labs for 6-14       Patient is scheduled for CPX labs, please order future labs, Thanks , Camelia Eng

## 2012-03-21 ENCOUNTER — Other Ambulatory Visit (INDEPENDENT_AMBULATORY_CARE_PROVIDER_SITE_OTHER): Payer: PRIVATE HEALTH INSURANCE

## 2012-03-21 DIAGNOSIS — E785 Hyperlipidemia, unspecified: Secondary | ICD-10-CM

## 2012-03-21 LAB — LIPID PANEL
HDL: 52.1 mg/dL (ref >39.00)
LDL Cholesterol: 116 mg/dL — ABNORMAL HIGH (ref 0–99)
Triglycerides: 105 mg/dL (ref 0.0–149.0)
VLDL: 21 mg/dL (ref 0.0–40.0)

## 2012-03-21 LAB — COMPREHENSIVE METABOLIC PANEL
Albumin: 4 g/dL (ref 3.5–5.2)
Alkaline Phosphatase: 64 U/L (ref 39–117)
CO2: 25 mEq/L (ref 19–32)
Chloride: 108 mEq/L (ref 96–112)
Creatinine, Ser: 0.8 mg/dL (ref 0.4–1.2)
Glucose, Bld: 78 mg/dL (ref 70–99)
Potassium: 4.1 mEq/L (ref 3.5–5.1)
Total Bilirubin: 0.9 mg/dL (ref 0.3–1.2)
Total Protein: 7.1 g/dL (ref 6.0–8.3)

## 2012-03-28 ENCOUNTER — Encounter: Payer: Self-pay | Admitting: Family Medicine

## 2012-03-28 ENCOUNTER — Ambulatory Visit (INDEPENDENT_AMBULATORY_CARE_PROVIDER_SITE_OTHER): Payer: PRIVATE HEALTH INSURANCE | Admitting: Family Medicine

## 2012-03-28 VITALS — BP 120/72 | HR 68 | Temp 98.7°F | Ht 65.0 in | Wt 139.2 lb

## 2012-03-28 DIAGNOSIS — E785 Hyperlipidemia, unspecified: Secondary | ICD-10-CM

## 2012-03-28 DIAGNOSIS — K219 Gastro-esophageal reflux disease without esophagitis: Secondary | ICD-10-CM

## 2012-03-28 DIAGNOSIS — F17200 Nicotine dependence, unspecified, uncomplicated: Secondary | ICD-10-CM

## 2012-03-28 DIAGNOSIS — J45909 Unspecified asthma, uncomplicated: Secondary | ICD-10-CM

## 2012-03-28 DIAGNOSIS — N912 Amenorrhea, unspecified: Secondary | ICD-10-CM

## 2012-03-28 DIAGNOSIS — N926 Irregular menstruation, unspecified: Secondary | ICD-10-CM

## 2012-03-28 LAB — POCT URINE PREGNANCY: Preg Test, Ur: NEGATIVE

## 2012-03-28 MED ORDER — VARENICLINE TARTRATE 1 MG PO TABS: 1.0000 mg | ORAL_TABLET | Freq: Two times a day (BID) | ORAL | Status: DC

## 2012-03-28 MED ORDER — VARENICLINE TARTRATE 0.5 MG X 11 & 1 MG X 42 PO MISC: ORAL | Status: DC

## 2012-03-28 NOTE — Assessment & Plan Note (Signed)
No recent exacerbations.  

## 2012-03-28 NOTE — Assessment & Plan Note (Signed)
Improved cholesterol with lifestyle changes.

## 2012-03-28 NOTE — Assessment & Plan Note (Signed)
Counseled briefly on smoking cessation 

## 2012-03-28 NOTE — Patient Instructions (Addendum)
Call [facility] to set up colonoscopy given mom's history. Don't push yourself to hard on elliptical. Drink 64 ounces of water daily. At least when at the gym afterward drink 24 ounces.  Eat three meals a day with more protein.

## 2012-03-28 NOTE — Progress Notes (Signed)
Subjective:    Patient ID: [name] [name], female    DOB: 1960-12-18, 51 y.o.   MRN: [id number]  HPI Here for annual chronic issue management.  CPX done at GYN.   Asthma, well controlled on no med. Last problem with this years ago.   GERD well controlled on no medicaiton.  High cholesterol..Improved now with weight loss and exercise. At goal LDL <130  Lab Results  Component Value Date   CHOL 189 03/21/2012   HDL 52.10 03/21/2012   LDLCALC 116* 03/21/2012   LDLDIRECT 155.9 11/20/2010   TRIG 105.0 03/21/2012   CHOLHDL 4 03/21/2012     Smoking... contemplative for smoking cessation.  Smoking 1 pack in 5 days... closet smoker.   Tried patch.. Stop for a while but restart.  Has been taken off OCPs by GYN. She states she has not had a period in several cycles. She is sexually active and requests preg test today. She has been having some moodiness that she associates with being off pill.  Exercising 5 days a week. Every time she exercises  with cardiovascular exercsie she get nauseous, lightheaded, weak. No chest pain Feels well otherwise. HR 150. If she stops exercsie for a few days she feels fine. She has lost 3 lbs in last 5 days, 12 lbs in past 2 months.  Has been trying to lose weight up to a certain point. She feels she is losing muscle mass. Joints feel much better. Frequently eats 1 meal a day. Has decreased appetite.    Review of Systems  Constitutional: Negative for fever, fatigue and unexpected weight change.  HENT: Negative for ear pain, congestion, sore throat, sneezing, trouble swallowing and sinus pressure.   Eyes: Negative for pain and itching.  Respiratory: Positive for shortness of breath. Negative for cough and wheezing.   Cardiovascular: Negative for chest pain, palpitations and leg swelling.  Gastrointestinal: Positive for nausea. Negative for abdominal pain, diarrhea, constipation and blood in stool.  Genitourinary: Negative for dysuria, hematuria, vaginal  discharge, difficulty urinating and menstrual problem.  Skin: Negative for rash.  Neurological: Negative for syncope, weakness, light-headedness, numbness and headaches.  Psychiatric/Behavioral: Negative for confusion and dysphoric mood. The patient is not nervous/anxious.        Objective:   Physical Exam  Constitutional: Vital signs are normal. She appears well-developed and well-nourished. She is cooperative.  Non-toxic appearance. She does not appear ill. No distress.  HENT:  Head: Normocephalic.  Right Ear: Hearing, tympanic membrane, external ear and ear canal normal.  Left Ear: Hearing, tympanic membrane, external ear and ear canal normal.  Nose: Nose normal.  Eyes: Conjunctivae, EOM and lids are normal. Pupils are equal, round, and reactive to light. No foreign bodies found.  Neck: Trachea normal and normal range of motion. Neck supple. Carotid bruit is not present. No mass and no thyromegaly present.  Cardiovascular: Normal rate, regular rhythm, S1 normal, S2 normal, normal heart sounds and intact distal pulses.  Exam reveals no gallop.   No murmur heard. Pulmonary/Chest: Effort normal and breath sounds normal. No respiratory distress. She has no wheezes. She has no rhonchi. She has no rales.  Abdominal: Soft. Normal appearance and bowel sounds are normal. She exhibits no distension, no fluid wave, no abdominal bruit and no mass. There is no hepatosplenomegaly. There is no tenderness. There is no rebound, no guarding and no CVA tenderness. No hernia.  Lymphadenopathy:    She has no cervical adenopathy.    She has no axillary adenopathy.  Neurological: She is alert. She has normal strength. No cranial nerve deficit or sensory deficit.  Skin: Skin is warm, dry and intact. No rash noted.  Psychiatric: Her speech is normal and behavior is normal. Judgment normal. Her mood appears not anxious. Cognition and memory are normal. She does not exhibit a depressed mood.            Assessment & Plan:  The patient's preventative maintenance and recommended screening tests for an annual wellness exam were reviewed in full today. Brought up to date unless services declined.  Counselled on the importance of diet, exercise, and its role in overall health and mortality. The patient's FH and SH was reviewed, including their home life, tobacco status, and drug and alcohol status.   Vaccines: Uptodate Colon: 09/2006, mother with colon cancer age 66. Pap/DVE/mammo: per GYN Current smoker

## 2012-03-28 NOTE — Assessment & Plan Note (Signed)
Resolved on no medication with diet control.

## 2012-05-20 ENCOUNTER — Telehealth: Payer: Self-pay

## 2012-05-20 NOTE — Telephone Encounter (Signed)
Pt request date of last annual exam with Dr Ermalene Searing for insurance question. Pt notified 03/28/12 was annual visit for chronic issues.

## 2012-07-08 ENCOUNTER — Encounter: Payer: Self-pay | Admitting: Gastroenterology

## 2012-10-22 ENCOUNTER — Other Ambulatory Visit: Payer: Self-pay | Admitting: Obstetrics and Gynecology

## 2012-10-22 DIAGNOSIS — Z1231 Encounter for screening mammogram for malignant neoplasm of breast: Secondary | ICD-10-CM

## 2012-10-28 ENCOUNTER — Encounter: Payer: Self-pay | Admitting: Gastroenterology

## 2012-11-12 ENCOUNTER — Ambulatory Visit
Admission: RE | Admit: 2012-11-12 | Discharge: 2012-11-12 | Disposition: A | Discharge status: Home / Self Care | Payer: 59 | Source: Ambulatory Visit | Attending: Obstetrics and Gynecology | Admitting: Obstetrics and Gynecology

## 2012-11-12 DIAGNOSIS — Z1231 Encounter for screening mammogram for malignant neoplasm of breast: Secondary | ICD-10-CM

## 2012-11-27 ENCOUNTER — Ambulatory Visit (AMBULATORY_SURGERY_CENTER): Payer: 59 | Admitting: *Deleted

## 2012-11-27 ENCOUNTER — Encounter: Payer: Self-pay | Admitting: Gastroenterology

## 2012-11-27 VITALS — Ht 65.0 in | Wt 148.4 lb

## 2012-11-27 DIAGNOSIS — Z860100 Personal history of colon polyps, unspecified: Secondary | ICD-10-CM

## 2012-11-27 DIAGNOSIS — Z1211 Encounter for screening for malignant neoplasm of colon: Secondary | ICD-10-CM

## 2012-11-27 DIAGNOSIS — Z800 Family history of malignant neoplasm of digestive organs: Secondary | ICD-10-CM

## 2012-11-27 MED ORDER — PEG-KCL-NACL-NASULF-NA ASC-C 100 G PO SOLR: ORAL | Status: DC

## 2012-11-27 NOTE — Progress Notes (Signed)
No allergy to eggs or soy products 

## 2012-12-02 ENCOUNTER — Encounter: Payer: Self-pay | Admitting: Gastroenterology

## 2012-12-02 ENCOUNTER — Ambulatory Visit (AMBULATORY_SURGERY_CENTER): Payer: 59 | Admitting: Gastroenterology

## 2012-12-02 VITALS — BP 107/68 | HR 51 | Temp 98.3°F | Resp 26 | Ht 65.0 in | Wt 148.0 lb

## 2012-12-02 DIAGNOSIS — Z86010 Personal history of colonic polyps: Secondary | ICD-10-CM

## 2012-12-02 DIAGNOSIS — D126 Benign neoplasm of colon, unspecified: Secondary | ICD-10-CM

## 2012-12-02 DIAGNOSIS — Z1211 Encounter for screening for malignant neoplasm of colon: Secondary | ICD-10-CM

## 2012-12-02 MED ORDER — SODIUM CHLORIDE 0.9 % IV SOLN: 500.0000 mL | INTRAVENOUS | Status: DC

## 2012-12-02 NOTE — Patient Instructions (Addendum)
YOU HAD AN ENDOSCOPIC PROCEDURE TODAY AT THE [facility]: Refer to the procedure report that was given to you for any specific questions about what was found during the examination.  If the procedure report does not answer your questions, please call your gastroenterologist to clarify.  If you requested that your care partner not be given the details of your procedure findings, then the procedure report has been included in a sealed envelope for you to review at your convenience later.  YOU SHOULD EXPECT: Some feelings of bloating in the abdomen. Passage of more gas than usual.  Walking can help get rid of the air that was put into your GI tract during the procedure and reduce the bloating. If you had a lower endoscopy (such as a colonoscopy or flexible sigmoidoscopy) you may notice spotting of blood in your stool or on the toilet paper. If you underwent a bowel prep for your procedure, then you may not have a normal bowel movement for a few days.  DIET: Your first meal following the procedure should be a light meal and then it is ok to progress to your normal diet.  A half-sandwich or bowl of soup is an example of a good first meal.  Heavy or fried foods are harder to digest and may make you feel nauseous or bloated.  Likewise meals heavy in dairy and vegetables can cause extra gas to form and this can also increase the bloating.  Drink plenty of fluids but you should avoid alcoholic beverages for 24 hours.  ACTIVITY: Your care partner should take you home directly after the procedure.  You should plan to take it easy, moving slowly for the rest of the day.  You can resume normal activity the day after the procedure however you should NOT DRIVE or use heavy machinery for 24 hours (because of the sedation medicines used during the test).    SYMPTOMS TO REPORT IMMEDIATELY: A gastroenterologist can be reached at any hour.  During normal business hours, 8:30 AM to 5:00 PM Monday through Friday,  call [phone].  After hours and on weekends, please call the GI answering service at [phone] who will take a message and have the physician on call contact you.   Following lower endoscopy (colonoscopy or flexible sigmoidoscopy):  Excessive amounts of blood in the stool  Significant tenderness or worsening of abdominal pains  Swelling of the abdomen that is new, acute  Fever of 100F or higher    FOLLOW UP: If any biopsies were taken you will be contacted by phone or by letter within the next 1-3 weeks.  Call your gastroenterologist if you have not heard about the biopsies in 3 weeks.  Our staff will call the home number listed on your records the next business day following your procedure to check on you and address any questions or concerns that you may have at that time regarding the information given to you following your procedure. This is a courtesy call and so if there is no answer at the home number and we have not heard from you through the emergency physician on call, we will assume that you have returned to your regular daily activities without incident.  SIGNATURES/CONFIDENTIALITY: You and/or your care partner have signed paperwork which will be entered into your electronic medical record.  These signatures attest to the fact that that the information above on your After Visit Summary has been reviewed and is understood.  Full responsibility of the confidentiality   of this discharge information lies with you and/or your care-partner.   Information on polyps & hemorrhoids given to you today 

## 2012-12-02 NOTE — Progress Notes (Signed)
Called to room to assist during endoscopic procedure.  Patient ID and intended procedure confirmed with present staff. Received instructions for my participation in the procedure from the performing physician.  

## 2012-12-02 NOTE — Op Note (Signed)
[facility] [address] [place], [address]   COLONOSCOPY PROCEDURE REPORT  PATIENT: [name] [name], [name] [name]  MR#: [id number] BIRTHDATE: 13-Apr-1961 , 52  yrs. old GENDER: Female ENDOSCOPIST: Meryl Dare, MD, [facility] PROCEDURE DATE:  12/02/2012 PROCEDURE:   Colonoscopy with biopsy ASA CLASS:   Class II INDICATIONS:Patient's personal history of adenomatous colon polyps: 2007. MEDICATIONS: MAC sedation, administered by CRNA and propofol (Diprivan) 300mg  IV DESCRIPTION OF PROCEDURE:   After the risks benefits and alternatives of the procedure were thoroughly explained, informed consent was obtained.  A digital rectal exam revealed no abnormalities of the rectum.   The LB [id number]-[id number]  endoscope was introduced through the anus and advanced to the cecum, which was identified by both the appendix and ileocecal valve. No adverse events experienced.   The quality of the prep was excellent, using MoviPrep  The instrument was then slowly withdrawn as the colon was fully examined.  COLON FINDINGS: A sessile polyp measuring 5 mm in size was found in the transverse colon.  A polypectomy was performed with cold forceps.  The resection was complete and the polyp tissue was completely retrieved.   The colon was otherwise normal.  There was no diverticulosis, inflammation, polyps or cancers unless previously stated.  Retroflexed views revealed internal hemorrhoids. The time to cecum=3 minutes 04 seconds.  Withdrawal time=10 minutes 16 seconds.  The scope was withdrawn and the procedure completed.  COMPLICATIONS: There were no complications.  ENDOSCOPIC IMPRESSION: 1.   Sessile polyp measuring 5 mm in the transverse colon; polypectomy performed with cold forceps 2.   Internal hemorrhoids  RECOMMENDATIONS: 1.  Await pathology results 2.  Repeat Colonoscopy in 5 years.   eSigned:  Meryl Dare, MD, [facility] 12/02/2012 10:59 AM

## 2012-12-02 NOTE — Progress Notes (Signed)
Patient did not experience any of the following events: a burn prior to discharge; a fall within the facility; wrong site/side/patient/procedure/implant event; or a hospital transfer or hospital admission upon discharge from the facility. (G8907) Patient did not have preoperative order for IV antibiotic SSI prophylaxis. (G8918)  

## 2012-12-03 ENCOUNTER — Telehealth: Payer: Self-pay

## 2012-12-03 NOTE — Telephone Encounter (Signed)
  Follow up Call-  Call back number 12/02/2012  Post procedure Call Back phone  # [phone]  Permission to leave phone message Yes     Patient questions:  Do you have a fever, pain , or abdominal swelling? no Pain Score  0 *  Have you tolerated food without any problems? yes  Have you been able to return to your normal activities? yes  Do you have any questions about your discharge instructions: Diet   no Medications  no Follow up visit  no  Do you have questions or concerns about your Care? no  Actions: * If pain score is 4 or above: No action needed, pain <4.  No problems per the pt.  She was on the way to work. [place]aw

## 2012-12-09 ENCOUNTER — Encounter: Payer: Self-pay | Admitting: Gastroenterology

## 2012-12-09 NOTE — Progress Notes (Signed)
Sessile polyp Repeat in 5 years per Dr. Russella Dar. Noted.

## 2012-12-21 ENCOUNTER — Telehealth: Payer: Self-pay | Admitting: Family Medicine

## 2012-12-21 DIAGNOSIS — E785 Hyperlipidemia, unspecified: Secondary | ICD-10-CM

## 2012-12-21 NOTE — Telephone Encounter (Signed)
Message copied by Excell Seltzer on Sun Dec 21, 2012 11:41 PM ------      Message from: Baldomero Lamy      Created: Tue Dec 16, 2012  1:21 PM      Regarding: cpx labs scheduled Mon 12/22/12       Please order  future cpx labs for pt's upcoming lab appt.      Thanks      Tasha       ------

## 2012-12-22 ENCOUNTER — Other Ambulatory Visit (INDEPENDENT_AMBULATORY_CARE_PROVIDER_SITE_OTHER): Payer: 59

## 2012-12-22 DIAGNOSIS — Z0000 Encounter for general adult medical examination without abnormal findings: Secondary | ICD-10-CM

## 2012-12-22 DIAGNOSIS — E785 Hyperlipidemia, unspecified: Secondary | ICD-10-CM

## 2012-12-22 LAB — LDL CHOLESTEROL, DIRECT: Direct LDL: 126.6 mg/dL

## 2012-12-22 LAB — COMPREHENSIVE METABOLIC PANEL
Albumin: 4.1 g/dL (ref 3.5–5.2)
Alkaline Phosphatase: 62 U/L (ref 39–117)
BUN: 10 mg/dL (ref 6–23)
Calcium: 9 mg/dL (ref 8.4–10.5)
Chloride: 105 mEq/L (ref 96–112)
Glucose, Bld: 97 mg/dL (ref 70–99)
Potassium: 3.9 mEq/L (ref 3.5–5.1)
Sodium: 136 mEq/L (ref 135–145)
Total Protein: 7 g/dL (ref 6.0–8.3)

## 2012-12-22 LAB — LIPID PANEL
Cholesterol: 201 mg/dL — ABNORMAL HIGH (ref 0–200)
Total CHOL/HDL Ratio: 5

## 2012-12-26 ENCOUNTER — Ambulatory Visit (INDEPENDENT_AMBULATORY_CARE_PROVIDER_SITE_OTHER): Payer: 59 | Admitting: Family Medicine

## 2012-12-26 ENCOUNTER — Encounter: Payer: Self-pay | Admitting: Family Medicine

## 2012-12-26 VITALS — BP 120/64 | HR 69 | Temp 98.5°F | Ht 65.0 in | Wt 146.5 lb

## 2012-12-26 DIAGNOSIS — Z716 Tobacco abuse counseling: Secondary | ICD-10-CM

## 2012-12-26 DIAGNOSIS — R4589 Other symptoms and signs involving emotional state: Secondary | ICD-10-CM | POA: Insufficient documentation

## 2012-12-26 DIAGNOSIS — J45909 Unspecified asthma, uncomplicated: Secondary | ICD-10-CM

## 2012-12-26 DIAGNOSIS — E785 Hyperlipidemia, unspecified: Secondary | ICD-10-CM

## 2012-12-26 DIAGNOSIS — Z0000 Encounter for general adult medical examination without abnormal findings: Secondary | ICD-10-CM

## 2012-12-26 DIAGNOSIS — K219 Gastro-esophageal reflux disease without esophagitis: Secondary | ICD-10-CM

## 2012-12-26 MED ORDER — VARENICLINE TARTRATE 1 MG PO TABS: 1.0000 mg | ORAL_TABLET | Freq: Two times a day (BID) | ORAL | Status: AC

## 2012-12-26 MED ORDER — VARENICLINE TARTRATE 0.5 MG X 11 & 1 MG X 42 PO MISC: ORAL | Status: AC

## 2012-12-26 NOTE — Assessment & Plan Note (Signed)
Doubt menopause given nml periods now and no other symptoms.  no clear depression. ? PMDD.. If worsening consider medication to treat.

## 2012-12-26 NOTE — Progress Notes (Signed)
Subjective:    Patient ID: [name] [name], female    DOB: 1960/11/20, 52 y.o.   MRN: [id number]  HPI  52 year old female presents for follow up of chronic health issues.  Seeing GYN Dr. Ambrose Mantle: Have appt for pap next week.  Elevated Cholesterol:  Well controlled on no emds. Lab Results  Component Value Date   CHOL 201* 12/22/2012   HDL 38.10* 12/22/2012   LDLCALC 116* 03/21/2012   LDLDIRECT 126.6 12/22/2012   TRIG 174.0* 12/22/2012   CHOLHDL 5 12/22/2012  Using medications without problems: Muscle aches:  Diet compliance: Limits fat and carb intake. Exercise: 3 times a week. Other complaints:  Irregular menses in last year after coming off OCPs.  Did not have menses for 3 months, but since then menses coming on monthly and regualr. She is wondering if she may be going through menopause. No  hot flashes More irritable, fatigue, 2 weeks prior to menses , wants to be alone more. No depression, no anxiety, no insomnia. Mother went through menopause in 23s.      Review of Systems  Constitutional: Negative for fever, fatigue and unexpected weight change.  HENT: Negative for ear pain, congestion, sore throat, sneezing, trouble swallowing and sinus pressure.   Eyes: Negative for pain and itching.  Respiratory: Negative for cough, shortness of breath and wheezing.   Cardiovascular: Negative for chest pain, palpitations and leg swelling.  Gastrointestinal: Negative for nausea, abdominal pain, diarrhea, constipation and blood in stool.  Genitourinary: Negative for dysuria, hematuria, vaginal discharge, difficulty urinating and menstrual problem.  Skin: Negative for rash.  Neurological: Negative for syncope, weakness, light-headedness, numbness and headaches.  Psychiatric/Behavioral: Negative for confusion and dysphoric mood. The patient is not nervous/anxious.        Objective:   Physical Exam  Constitutional: Vital signs are normal. She appears well-developed and well-nourished.  She is cooperative.  Non-toxic appearance. She does not appear ill. No distress.  HENT:  Head: Normocephalic.  Right Ear: Hearing, tympanic membrane, external ear and ear canal normal. Tympanic membrane is not erythematous, not retracted and not bulging.  Left Ear: Hearing, tympanic membrane, external ear and ear canal normal. Tympanic membrane is not erythematous, not retracted and not bulging.  Nose: No mucosal edema or rhinorrhea. Right sinus exhibits no maxillary sinus tenderness and no frontal sinus tenderness. Left sinus exhibits no maxillary sinus tenderness and no frontal sinus tenderness.  Mouth/Throat: Uvula is midline, oropharynx is clear and moist and mucous membranes are normal.  Eyes: Conjunctivae, EOM and lids are normal. Pupils are equal, round, and reactive to light. No foreign bodies found.  Neck: Trachea normal and normal range of motion. Neck supple. Carotid bruit is not present. No mass and no thyromegaly present.  Cardiovascular: Normal rate, regular rhythm, S1 normal, S2 normal, normal heart sounds, intact distal pulses and normal pulses.  Exam reveals no gallop and no friction rub.   No murmur heard. Pulmonary/Chest: Effort normal and breath sounds normal. Not tachypneic. No respiratory distress. She has no decreased breath sounds. She has no wheezes. She has no rhonchi. She has no rales.  Abdominal: Soft. Normal appearance and bowel sounds are normal. There is no tenderness.  Neurological: She is alert.  Skin: Skin is warm, dry and intact. No rash noted.  Psychiatric: Her speech is normal and behavior is normal. Judgment and thought content normal. Her mood appears not anxious. Cognition and memory are normal. She does not exhibit a depressed mood.  Assessment & Plan:  The patient's preventative maintenance and recommended screening tests for an annual wellness exam were reviewed in full today. Brought up to date unless services declined.  Counselled on the  importance of diet, exercise, and its role in overall health and mortality. The patient's FH and SH was reviewed, including their home life, tobacco status, and drug and alcohol status.   Vaccines: Uptodate with Td, refuses flu. Smoker, 1 pack per week... Never tried Chantix. Plans to try patch.  Colon:  Dr. Russella Dar, tubular adenoma 2014, repeat recommended in  5 years. Mother colon cancer.  DVE: per GYN  Mammo: 10/2012 nml

## 2012-12-26 NOTE — Assessment & Plan Note (Signed)
Stable control. 

## 2012-12-26 NOTE — Assessment & Plan Note (Signed)
  LDL at goal but HDL and trig to high. Encouraged exercise, weight loss, healthy eating habits. Consider adding fish oil.

## 2012-12-26 NOTE — Assessment & Plan Note (Signed)
The patient was counseled on the dangers of tobacco use, and was advised to quit.  Reviewed strategies to maximize success, including removing cigarettes and smoking materials from environment, stress management, substitution of other forms of reinforcement and support of family/friends. I recommended her trying chantix for cessation. Prescription provided. Also I recommended her to tell her boyfriend for him to help encourage her to quit.

## 2012-12-26 NOTE — Assessment & Plan Note (Signed)
Stable control on current medication. 

## 2012-12-26 NOTE — Patient Instructions (Addendum)
Work on quitting smoking. Decrease candy in diet. Work on regular exercsie.Marland Kitchen 3-5 days a week.

## 2013-02-06 ENCOUNTER — Encounter: Payer: Self-pay | Admitting: Family Medicine

## 2013-02-06 ENCOUNTER — Ambulatory Visit (INDEPENDENT_AMBULATORY_CARE_PROVIDER_SITE_OTHER): Payer: 59 | Admitting: Family Medicine

## 2013-02-06 VITALS — BP 120/72 | HR 72 | Temp 98.2°F | Ht 65.0 in | Wt 139.5 lb

## 2013-02-06 DIAGNOSIS — J209 Acute bronchitis, unspecified: Secondary | ICD-10-CM

## 2013-02-06 MED ORDER — AZITHROMYCIN 250 MG PO TABS: ORAL_TABLET | ORAL | Status: DC

## 2013-02-06 MED ORDER — GUAIFENESIN-CODEINE 100-10 MG/5ML PO SYRP: 5.0000 mL | ORAL_SOLUTION | Freq: Every evening | ORAL | Status: DC | PRN

## 2013-02-06 NOTE — Progress Notes (Signed)
  Subjective:    Patient ID: [name] [name], female    DOB: 05-26-1961, 52 y.o.   MRN: [id number]  Cough This is a new problem. The current episode started in the past 7 days (started with sudden flu like illness and cough). The problem has been gradually worsening. The problem occurs constantly. The cough is productive of sputum. Associated symptoms include chills, ear congestion, a fever, headaches, myalgias, nasal congestion, postnasal drip, a sore throat and shortness of breath. Pertinent negatives include no rhinorrhea or wheezing. Associated symptoms comments: Subjective Nausea  SOB mild like she cannot talk as much.. Nothing aggravates the symptoms. Risk factors: Stopped smoking on cruise but is longterm smoker. Treatments tried: mucinex DM daily for 6 days, as well as cold and flu. The treatment provided mild relief. Her past medical history is significant for asthma. There is no history of COPD, emphysema or environmental allergies.      Review of Systems  Constitutional: Positive for fever and chills.  HENT: Positive for sore throat and postnasal drip. Negative for rhinorrhea.   Respiratory: Positive for cough and shortness of breath. Negative for wheezing.   Musculoskeletal: Positive for myalgias.  Allergic/Immunologic: Negative for environmental allergies.  Neurological: Positive for headaches.       Objective:   Physical Exam  Constitutional: Vital signs are normal. She appears well-developed and well-nourished. She is cooperative.  Non-toxic appearance. She does not appear ill. No distress.  Fatigued appearing  HENT:  Head: Normocephalic.  Right Ear: Hearing, tympanic membrane, external ear and ear canal normal. Tympanic membrane is not erythematous, not retracted and not bulging.  Left Ear: Hearing, tympanic membrane, external ear and ear canal normal. Tympanic membrane is not erythematous, not retracted and not bulging.  Nose: Mucosal edema and rhinorrhea present. Right  sinus exhibits no maxillary sinus tenderness and no frontal sinus tenderness. Left sinus exhibits no maxillary sinus tenderness and no frontal sinus tenderness.  Mouth/Throat: Uvula is midline, oropharynx is clear and moist and mucous membranes are normal.  Eyes: Conjunctivae, EOM and lids are normal. Pupils are equal, round, and reactive to light. No foreign bodies found.  Neck: Trachea normal and normal range of motion. Neck supple. Carotid bruit is not present. No mass and no thyromegaly present.  Cardiovascular: Normal rate, regular rhythm, S1 normal, S2 normal, normal heart sounds, intact distal pulses and normal pulses.  Exam reveals no gallop and no friction rub.   No murmur heard. Pulmonary/Chest: Effort normal and breath sounds normal. Not tachypneic. No respiratory distress. She has no decreased breath sounds. She has no wheezes. She has no rhonchi. She has no rales.  Neurological: She is alert.  Skin: Skin is warm, dry and intact. No rash noted.  Psychiatric: Her speech is normal and behavior is normal. Judgment normal. Her mood appears not anxious. Cognition and memory are normal. She does not exhibit a depressed mood.          Assessment & Plan:

## 2013-02-06 NOTE — Patient Instructions (Addendum)
Mucinex DM during the day. Cough suppressant at night. Complete antibiotics. Call if shortness of breath.

## 2013-02-06 NOTE — Assessment & Plan Note (Signed)
Worsening at 1 week. Treat with antibiotics. Symptom care.

## 2013-02-09 ENCOUNTER — Telehealth: Payer: Self-pay | Admitting: Family Medicine

## 2013-02-09 ENCOUNTER — Ambulatory Visit (INDEPENDENT_AMBULATORY_CARE_PROVIDER_SITE_OTHER): Payer: 59 | Admitting: Family Medicine

## 2013-02-09 ENCOUNTER — Encounter: Payer: Self-pay | Admitting: Family Medicine

## 2013-02-09 VITALS — BP 120/70 | HR 62 | Temp 97.9°F | Ht 65.0 in | Wt 140.5 lb

## 2013-02-09 DIAGNOSIS — J111 Influenza due to unidentified influenza virus with other respiratory manifestations: Secondary | ICD-10-CM

## 2013-02-09 DIAGNOSIS — R6889 Other general symptoms and signs: Secondary | ICD-10-CM

## 2013-02-09 DIAGNOSIS — J45909 Unspecified asthma, uncomplicated: Secondary | ICD-10-CM

## 2013-02-09 MED ORDER — DEXAMETHASONE SODIUM PHOSPHATE 10 MG/ML IJ SOLN
10.0000 mg | Freq: Once | INTRAMUSCULAR | Status: AC
  Administered 2013-02-09: 10 mg via INTRAMUSCULAR

## 2013-02-09 NOTE — Telephone Encounter (Signed)
ok 

## 2013-02-09 NOTE — Telephone Encounter (Signed)
Confidential Office Message [address] [place], [place] [address] p. [phone] f. [phone] To: [facility] (After Hours Triage) Fax: [phone] From: [facility] Date/ Time: 02/09/2013 7:04 AM Taken By: Jethro BolusMarylee Floras Facility: home Patient: [name] [name], [name] [name] DOB: 11/28/1960 Phone: [phone] Reason for Call: Sinus infection not improving despite antibiotics for 2 full days. Pt advised to call back when open this morning 5/5 at 8 a.m. to report sxs to Dr. Ermalene Searing per triage disposition. Regarding Appointment: Appt Date: Appt Time: Unknown Provider: Reason: Details: Confidential Outcome:

## 2013-02-09 NOTE — Telephone Encounter (Signed)
Patient coming now

## 2013-02-09 NOTE — Progress Notes (Signed)
[facility] at [facility] [address] [place] [address] Phone: [phone] Fax: [phone]  Date:  02/09/2013   Name:  [name] [name]   DOB:  [birth year]   MRN:  [id number] Gender: female Age: 52 y.o.  Primary Physician:  Kerby Nora, MD  Evaluating MD: Hannah Beat, MD   Chief Complaint: Bronchitis   History of Present Illness:  [name] [name] is a 52 y.o. pleasant patient who presents with the following:  Started to get sick on April 26 - sleeping since then day and night. Has been taking dristan cold medicine. Alleve. Got home on Thursday and saw Dr. Leonard Schwartz on Friday. Dry cough since April 26. Feels like sinus pressure all in her head. Will make her drowsy. A dull pain behind her eyes and nauseous.   The history that I get, the patient had some very significant myalgias and polyarthralgia's at the beginning along with some fever. She has been having some significant cough and also some nasal symptoms as well. She was recently started on some Zithromax, but she continues to have some difficulty, and has had some wheezing as well.  Patient Active Problem List   Diagnosis Date Noted  . Acute bronchitis 02/06/2013  . Tobacco abuse counseling 12/26/2012  . Moodiness 12/26/2012  . ASTHMA, INTERMITTENT, MILD 11/23/2010  . HYPERLIPIDEMIA 10/24/2007  . GERD 10/24/2007    No past medical history on file.  Past Surgical History  Procedure Laterality Date  . Ganglion cyst excision  11/2005  . Colonoscopy      History   Social History  . Marital Status: Single    Spouse Name: N/A    Number of Children: 1  . Years of Education: N/A   Occupational History  . Mortgages    Social History Main Topics  . Smoking status: Current Some Day Smoker    Types: Cigarettes  . Smokeless tobacco: Never Used  . Alcohol Use: Yes     Comment: very, very rarely  . Drug Use: No  . Sexually Active: No   Other Topics Concern  . Not on file   Social History  Narrative  . No narrative on file    Family History  Problem Relation Age of Onset  . Cancer Mother     lung cancer age 56, colon cancer age 65  . Hypertension Mother   . Colon cancer Mother   . Heart disease Father   . COPD Father   . Stroke Sister   . Heart disease Sister   . Stroke Sister   . Heart disease Sister   . Colon cancer Maternal Aunt   . Esophageal cancer Neg Hx   . Stomach cancer Neg Hx   . Rectal cancer Neg Hx     No Known Allergies  Medication list has been reviewed and updated.  Outpatient Prescriptions Prior to Visit  Medication Sig Dispense Refill  . azithromycin (ZITHROMAX) 250 MG tablet 2 tab po x 1 day then 1 tab po daily  6 tablet  0  . guaiFENesin-codeine (ROBITUSSIN AC) 100-10 MG/5ML syrup Take 5-10 mLs by mouth at bedtime as needed for cough.  180 mL  0  . varenicline (CHANTIX CONTINUING MONTH PAK) 1 MG tablet Take 1 tablet (1 mg total) by mouth 2 (two) times daily.  60 tablet  4  . varenicline (CHANTIX STARTING MONTH PAK) 0.5 MG X 11 & 1 MG X 42 tablet Take one 0.5 mg tablet by mouth once daily  for 3 days, then increase to one 0.5 mg tablet twice daily for 4 days, then increase to one 1 mg tablet twice daily.  53 tablet  0   No facility-administered medications prior to visit.    Review of Systems:  ROS: GEN: Acute illness details above GI: Tolerating PO intake GU: maintaining adequate hydration and urination Pulm: No SOB Interactive and getting along well at home.  Otherwise, ROS is as per the HPI.   Physical Examination: BP 120/70  Pulse 62  Temp(Src) 97.9 F (36.6 C) (Oral)  Ht 5\' 5"  (1.651 m)  Wt 140 lb 8 oz (63.73 kg)  BMI 23.38 kg/m2  SpO2 97%  Ideal Body Weight: Weight in (lb) to have BMI = 25: 149.9   Gen: WDWN, NAD; A & O x3, cooperative. Pleasant.Globally Non-toxic HEENT: Normocephalic and atraumatic. Throat clear, w/o exudate, R TM clear, L TM - good landmarks, No fluid present. + rhinnorhea. No frontal or maxillary  sinus T. MMM NECK: Anterior cervical  LAD is mildly present CV: RRR, No M/G/R, cap refill <2 sec PULM: Breathing comfortably in no respiratory distress. no wheezing, crackles, rhonchi ABD: S,NT,ND,+BS. No HSM. No rebound. EXT: No c/c/e PSYCH: Friendly, good eye contact MSK: Nml gait   Assessment and Plan:  Flu-like symptoms  Unspecified asthma - Plan: dexamethasone (DECADRON) injection 10 mg  Thinking about the history now, I wonder if the patient did actually have influenza. It has been in the community certainly, and she was traveling a great deal at that time. She also is having some intermittent wheezing and is a smoker. I'm suspicious that she may have some underlying pulmonary changes.. We will give her 10 mg of Decadron IM to see if this helps with some of her symptoms. Also told her that she may have a cough lasting for up to 2 weeks from now.  Orders Today:  No orders of the defined types were placed in this encounter.    Updated Medication List: (Includes new medications, updates to list, dose adjustments) Meds ordered this encounter  Medications  . dexamethasone (DECADRON) injection 10 mg    Sig:     Medications Discontinued: There are no discontinued medications.    Signed, Elpidio Galea. Sofie Schendel, MD 02/09/2013 9:48 AM

## 2013-02-09 NOTE — Telephone Encounter (Signed)
Will route to Dr. Patsy Lager since Dr. B not here

## 2013-02-09 NOTE — Telephone Encounter (Signed)
Pt seen 02/06/13;pt wants to sleep all the time;head congested,eyes burn,nose hurts, right side of neck hurts, dizziness,nausea and gets SOB when talks. Pt has no appetite and has lost 10 lbs in one week. No S/T, chest congestion, wheezing,earache,and cough has stopped. Has no thermometer so not sure if fever or not. Pt has taken antibiotic for 3 days and is not taking Mucinex. Pt has high deductible and only wants appt if can get prednisone injection so she can get back to work.[facility]. Please advise.

## 2013-02-09 NOTE — Telephone Encounter (Signed)
[facility] Triage Call Report Triage Record Num: [id number] Operator: Judeen Hammans Patient Name: [name] [name] Call Date & Time: 02/09/2013 6:45:08AM Patient Phone: [phone] PCP: Kerby Nora Patient Gender: Female PCP Fax : [phone] Patient DOB: [birth year] Practice Name: [facility] Reason for Call: Caller: [name] [name]/Patient; PCP: Kerby Nora (Family Practice); CB#: [phone]; Call regarding Sinus infection; Seen in office 5/2 for upper respiratory symptoms and given Z-Pack. Pt feels like her head is swimming despite 2 full days on antibiotics. When pt gets out of bed feels nauseated and head feels full to the point of dizziness. Onset 4/26. Afebrile. Losing weight; no appetite. All emergent sxs ruled out per Upper Respiratory Infection Guideline except for "Being treated by a provider for a secondary infection and no improvement in symptoms, symptoms have worsened or has new symptoms after following treatment plan for the time specified by provider." Advised to call office back at 8 a.m. when open per triage disposition. Pt agrees with plan of care. Home care advice given. Protocol(s) Used: Upper Respiratory Infection (URI) Recommended Outcome per Protocol: Call Provider within 8 Hours Reason for Outcome: Being treated by a provider for a secondary infection AND no improvement in symptoms, symptoms have worsened OR has new symptoms after following treatment plan for the time specified by provider. Care Advice: ~ Continue to follow treatment plan, including medications, until evaluated by provider. ~ Go to the ED if has new onset of stiff neck, vomiting, drowsiness or confusion. Drink more fluids -- water, low-sugar juices, tea and warm soup, especially chicken broth, are options. Avoid caffeinated or alcoholic beverages because they can increase the chance of dehydration. ~ ~ SYMPTOM / CONDITION MANAGEMENT ~ CAUTIONS See your provider today if you have a  temperature 101.37F (38.6C) or higher, increased difficulty breathing, chest pain with cough, or cough with blood-tinged sputum. ~ 02/09/2013 7:01:07AM Page 1 of 1 [id number]

## 2013-02-17 ENCOUNTER — Telehealth: Payer: Self-pay

## 2013-02-17 NOTE — Telephone Encounter (Signed)
Phone call from pt stating she had a colonoscopy with Dr Russella Dar and it should have been coded as screening but was not. So now her insurance will not cover the entire cost of the procedure. She states the billing department has been un-cooperative and is not sure where to go from here. Please advise.

## 2013-02-18 NOTE — Telephone Encounter (Signed)
Call to patient  To let her know that [name] [name] (the GI site manager will be giving her a call regarding her bill). Explained to her that Primary care could not handle this for her because it was a GI procedure.

## 2013-02-19 NOTE — Telephone Encounter (Signed)
Have forwarded to [facility] to call on my behalf.

## 2013-04-24 ENCOUNTER — Ambulatory Visit (INDEPENDENT_AMBULATORY_CARE_PROVIDER_SITE_OTHER): Payer: Self-pay | Admitting: Family Medicine

## 2013-04-24 ENCOUNTER — Encounter: Payer: Self-pay | Admitting: Family Medicine

## 2013-04-24 VITALS — BP 112/70 | HR 79 | Temp 98.5°F | Ht 65.0 in

## 2013-04-24 DIAGNOSIS — N61 Mastitis without abscess: Secondary | ICD-10-CM | POA: Insufficient documentation

## 2013-04-24 DIAGNOSIS — N951 Menopausal and female climacteric states: Secondary | ICD-10-CM | POA: Insufficient documentation

## 2013-04-24 MED ORDER — CEPHALEXIN 500 MG PO CAPS: 500.0000 mg | ORAL_CAPSULE | Freq: Three times a day (TID) | ORAL | Status: DC

## 2013-04-24 NOTE — Assessment & Plan Note (Signed)
2-3 cm area L breast above nipple tx with keflex 500 tid with food and inst to use warm compress and firmly fitting/supportive bra Call/follow up or seek care if not improved

## 2013-04-24 NOTE — Assessment & Plan Note (Signed)
Pt has some vasomotor symptoms as well as period changes -disc this in detail , she is not interested in HRT and I do not recommend that either She may be a candidate for SSRI in the future if her mood symptoms worsen-and she will disc that with her PCP

## 2013-04-24 NOTE — Progress Notes (Signed)
Subjective:    Patient ID: [name] [name], female    DOB: 1961/08/09, 52 y.o.   MRN: [id number]  HPI Here for menopause sympt and breast issue   Having hot flashes/ not sleeping / irritable / hot natured  Off OC for a year Period is changing - not skipping but changing in nature   Woke up lat night with a sore and red - above the nipple  Is utd mammo 2/14 No nipple discharge Put heat on it last night- and that helped with swelling  Very sore No fever - does not think  Patient Active Problem List   Diagnosis Date Noted  . Acute bronchitis 02/06/2013  . Tobacco abuse counseling 12/26/2012  . Moodiness 12/26/2012  . ASTHMA, INTERMITTENT, MILD 11/23/2010  . HYPERLIPIDEMIA 10/24/2007  . GERD 10/24/2007   No past medical history on file. Past Surgical History  Procedure Laterality Date  . Ganglion cyst excision  11/2005  . Colonoscopy     History  Substance Use Topics  . Smoking status: Current Some Day Smoker    Types: Cigarettes  . Smokeless tobacco: Never Used     Comment: only smokes a few days a week  . Alcohol Use: Yes     Comment: very, very rarely   Family History  Problem Relation Age of Onset  . Cancer Mother     lung cancer age 51, colon cancer age 57  . Hypertension Mother   . Colon cancer Mother   . Heart disease Father   . COPD Father   . Stroke Sister   . Heart disease Sister   . Stroke Sister   . Heart disease Sister   . Colon cancer Maternal Aunt   . Esophageal cancer Neg Hx   . Stomach cancer Neg Hx   . Rectal cancer Neg Hx    No Known Allergies No current outpatient prescriptions on file prior to visit.   No current facility-administered medications on file prior to visit.     Review of Systems Review of Systems  Constitutional: Negative for fever, appetite change,  and unexpected weight change.  Eyes: Negative for pain and visual disturbance.  Respiratory: Negative for cough and shortness of breath.   Cardiovascular: Negative for  cp or palpitations    Gastrointestinal: Negative for nausea, diarrhea and constipation.  Genitourinary: Negative for urgency and frequency. neg for pelvic pain  Skin: Negative for pallor or rash  pos for redness on left breast with tenderness/ neg for nipple discharge  Neurological: Negative for weakness, light-headedness, numbness and headaches.  Hematological: Negative for adenopathy. Does not bruise/bleed easily.  Psychiatric/Behavioral: Negative for dysphoric mood. The patient is not nervous/anxious.         Objective:   Physical Exam  Constitutional: She appears well-developed and well-nourished. No distress.  HENT:  Head: Normocephalic and atraumatic.  Eyes: Conjunctivae and EOM are normal. Pupils are equal, round, and reactive to light.  Cardiovascular: Normal rate and regular rhythm.   Genitourinary: There is breast swelling and tenderness. No breast discharge or bleeding.  L breast has a 2-3 cm triangular shaped area of erythema and induration just superior to the nipple No discharge/ wound or nipple discharge, but it is tender   No other findings on breast exam and axilla appears nl   Musculoskeletal: She exhibits no edema.  Neurological: She is alert.  Skin: Skin is warm and dry. No rash noted. There is erythema. No pallor.  Psychiatric: She has a normal mood and  affect.          Assessment & Plan:

## 2013-04-24 NOTE — Patient Instructions (Addendum)
Take the keflex as directed three times daily (ok to take with food) Use warm compresses when able  Watch for enlarging area of redness or swelling/ increased pain or fever - and let us know or seek care in the ER if after hours

## 2013-05-08 ENCOUNTER — Telehealth: Payer: Self-pay

## 2013-05-08 MED ORDER — FLUCONAZOLE 150 MG PO TABS: 150.0000 mg | ORAL_TABLET | Freq: Once | ORAL | Status: DC

## 2013-05-08 NOTE — Telephone Encounter (Signed)
Pt started on cephalexin on 04/24/13; on 05/07/13 pt started with thick, white vaginal discharge; no itching. Pt request med to [facility].Please advise.

## 2013-05-08 NOTE — Telephone Encounter (Signed)
Sent in antifungal to treat likely candida

## 2013-05-08 NOTE — Telephone Encounter (Signed)
Spoke with patient and advised results   

## 2013-07-27 ENCOUNTER — Encounter: Payer: Self-pay | Admitting: Family Medicine

## 2013-07-27 ENCOUNTER — Ambulatory Visit (INDEPENDENT_AMBULATORY_CARE_PROVIDER_SITE_OTHER): Payer: BC Managed Care – PPO | Admitting: Family Medicine

## 2013-07-27 VITALS — BP 96/60 | HR 57 | Temp 98.1°F | Ht 65.0 in | Wt 145.0 lb

## 2013-07-27 DIAGNOSIS — J069 Acute upper respiratory infection, unspecified: Secondary | ICD-10-CM

## 2013-07-27 MED ORDER — AZITHROMYCIN 250 MG PO TABS: ORAL_TABLET | ORAL | Status: DC

## 2013-07-27 NOTE — Progress Notes (Signed)
Patient Name: [name] [name] Date of Birth: [birth year] Medical Record Number: [id number]  History of Present Illness:  Patent presents with runny nose, sneezing, cough, sore throat, malaise and minimal / low-grade fever .  Cold, throat dry and scatchy. Sleeping all day. Runny eyes. Yesterday was cold  "Gets a shot and a z-pak."  + recent exposure to others with similar symptoms.   The patent denies sore throat as the primary complaint. Denies sthortness of breath/wheezing, high fever, chest pain, rhinits for more than 14 days, significant myalgia, otalgia, facial pain, abdominal pain, changes in bowel or bladder.  PMH, PHS, Allergies, Problem List, Medications, Family History, and Social History have all been reviewed.  Patient Active Problem List   Diagnosis Date Noted  . Mastitis 04/24/2013  . Peri-menopausal 04/24/2013  . Acute bronchitis 02/06/2013  . Tobacco abuse counseling 12/26/2012  . Moodiness 12/26/2012  . ASTHMA, INTERMITTENT, MILD 11/23/2010  . HYPERLIPIDEMIA 10/24/2007  . GERD 10/24/2007    No past medical history on file.  Past Surgical History  Procedure Laterality Date  . Ganglion cyst excision  11/2005  . Colonoscopy      History   Social History  . Marital Status: Single    Spouse Name: N/A    Number of Children: 1  . Years of Education: N/A   Occupational History  . Mortgages    Social History Main Topics  . Smoking status: Former Smoker    Types: Cigarettes  . Smokeless tobacco: Never Used     Comment: only smokes a few days a week  . Alcohol Use: Yes     Comment: very, very rarely  . Drug Use: No  . Sexual Activity: No   Other Topics Concern  . Not on file   Social History Narrative  . No narrative on file    Family History  Problem Relation Age of Onset  . Cancer Mother     lung cancer age 2, colon cancer age 39  . Hypertension Mother   . Colon cancer Mother   . Heart disease Father   . COPD Father   . Stroke  Sister   . Heart disease Sister   . Stroke Sister   . Heart disease Sister   . Colon cancer Maternal Aunt   . Esophageal cancer Neg Hx   . Stomach cancer Neg Hx   . Rectal cancer Neg Hx     No Known Allergies  No current outpatient prescriptions on file prior to visit.   No current facility-administered medications on file prior to visit.    Review of Systems: as above, eating and drinking - tolerating PO. Urinating normally. No excessive vomitting or diarrhea. O/w as above.  Physical Exam:  Filed Vitals:   07/27/13 1033  BP: 96/60  Pulse: 57  Temp: 98.1 F (36.7 C)  TempSrc: Oral  Height: 5\' 5"  (1.651 m)  Weight: 145 lb (65.772 kg)    GEN: WDWN, Non-toxic, Atraumatic, normocephalic. A and O x 3. HEENT: Oropharynx clear without exudate, MMM, no significant LAD, mild rhinnorhea Ears: TM clear, COL visualized with good landmarks CV: RRR, no m/g/r. Pulm: CTA B, no wheezes, rhonchi, or crackles, normal respiratory effort. EXT: no c/c/e Psych: well oriented, neither depressed nor anxious in appearance  A/P: 1. URI. Supportive care reviewed with patient. See patient instruction section.   Likely URI - patient with important business at end of week. Given zpak if worsens.  Meds ordered this encounter  Medications  .  azithromycin (ZITHROMAX Z-PAK) 250 MG tablet    Sig: Take 2 tablets (500 mg) on  Day 1,  followed by 1 tablet (250 mg) once daily on Days 2 through 5.    Dispense:  6 each    Refill:  0

## 2013-08-31 ENCOUNTER — Telehealth: Payer: Self-pay | Admitting: Family Medicine

## 2013-08-31 NOTE — Telephone Encounter (Signed)
Patient is scheduled to see Dr. Ermalene Searing tomorrow at 8:30am.

## 2013-08-31 NOTE — Telephone Encounter (Signed)
Patient Information:  Caller Name: Lennyx  Phone: [phone]  Patient: [name] [name], [name] [name]  Gender: Female  DOB: 1961/01/31  Age: 52 Years  PCP: Kerby Nora (Family Practice)  Pregnant: No  Office Follow Up:  Does the office need to follow up with this patient?: Yes  Instructions For The Office: Please call pt back and appt for 09/01/13 asap.  RN Note:  See Provider w/in 24 hrs for: Hard, tender or red lump on breast per Breast Sx-Female protocol in [facility]. No appt available for today. Epic blocked 1500 appt with R. Baity. Was going to make an appt with Dr. Ermalene Searing for 0830 on 09/01/13, but pt states front desk told her Dr. Ermalene Searing would not be there all week. Please call pt back and appt with Dr. Ermalene Searing, or any other MD, if [facility] not avail for 09/01/13. [facility] did not want to appt if schedule was wrong in Epic.  Symptoms  Reason For Call & Symptoms: Pt calling regarding reoccurring mastitis in left breast. Episode in 04/2013 but now having another episode. Knot in the same place as before. Afebirile. Not treated.  Reviewed Health History In EMR: Yes  Reviewed Medications In EMR: Yes  Reviewed Allergies In EMR: Yes  Reviewed Surgeries / Procedures: Yes  Date of Onset of Symptoms: 08/29/2013  Treatments Tried: lotion  Treatments Tried Worked: No OB / GYN:  LMP: Unknown  Guideline(s) Used:  No Protocol Available - Sick Adult  Disposition Per Guideline:   See Today or Tomorrow in Office  Reason For Disposition Reached:   Nursing judgment  Advice Given:  Call Back If:  New symptoms develop  You become worse.  Patient Will Follow Care Advice:  YES

## 2013-08-31 NOTE — Telephone Encounter (Signed)
Please help. [name] [name] could see her tomorrow

## 2013-09-01 ENCOUNTER — Ambulatory Visit (INDEPENDENT_AMBULATORY_CARE_PROVIDER_SITE_OTHER): Payer: Managed Care, Other (non HMO) | Admitting: Family Medicine

## 2013-09-01 ENCOUNTER — Encounter: Payer: Self-pay | Admitting: Family Medicine

## 2013-09-01 VITALS — BP 100/68 | HR 64 | Temp 97.9°F | Ht 66.0 in | Wt 147.0 lb

## 2013-09-01 DIAGNOSIS — N611 Abscess of the breast and nipple: Secondary | ICD-10-CM | POA: Insufficient documentation

## 2013-09-01 DIAGNOSIS — N61 Mastitis without abscess: Secondary | ICD-10-CM

## 2013-09-01 MED ORDER — CLINDAMYCIN HCL 300 MG PO CAPS: 300.0000 mg | ORAL_CAPSULE | Freq: Three times a day (TID) | ORAL | Status: DC

## 2013-09-01 NOTE — Assessment & Plan Note (Addendum)
Will cover for mRSa given recurrent issues in groin as well.  Preventative and bacteria erradication measures discussed.  Follow up if not improving as expected.

## 2013-09-01 NOTE — Progress Notes (Signed)
  Subjective:    Patient ID: [name] [name], female    DOB: Apr 04, 1961, 52 y.o.   MRN: [id number]  HPI  52 year old female with history of breast infection in 04/2013 treated with keflex presents with  Similar area that has returned above left nipple in last 2-3 days.  She had resolution of issue ( dried up and dark area remained) from 04/2013, but the area has gotten red and darkened again. Area is tender.  Breast is itchy.  She has had no discharge.  No breast mass, no nipple discharge.  She had hot flashes until 2 months ago. Has regular menses, slightly heavier since off OCP.  Last mammogram  nml 10/2013  She has been gatting boils in B groin.. She has seen dermatologists. Bx was nml.  Antibiotics have only helped temporaily. Last derm said it    Review of Systems  Constitutional: Negative for fever and fatigue.  HENT: Negative for ear pain.   Eyes: Negative for pain.  Respiratory: Negative for chest tightness and shortness of breath.   Cardiovascular: Negative for chest pain, palpitations and leg swelling.  Gastrointestinal: Negative for abdominal pain.  Genitourinary: Negative for dysuria.       Objective:   Physical Exam  Constitutional: Vital signs are normal. She appears well-developed and well-nourished. She is cooperative.  Non-toxic appearance. She does not appear ill. No distress.  HENT:  Head: Normocephalic.  Right Ear: Hearing, tympanic membrane, external ear and ear canal normal. Tympanic membrane is not erythematous, not retracted and not bulging.  Left Ear: Hearing, tympanic membrane, external ear and ear canal normal. Tympanic membrane is not erythematous, not retracted and not bulging.  Nose: No mucosal edema or rhinorrhea. Right sinus exhibits no maxillary sinus tenderness and no frontal sinus tenderness. Left sinus exhibits no maxillary sinus tenderness and no frontal sinus tenderness.  Mouth/Throat: Uvula is midline, oropharynx is clear and moist and  mucous membranes are normal.  Eyes: Conjunctivae, EOM and lids are normal. Pupils are equal, round, and reactive to light. Lids are everted and swept, no foreign bodies found.  Neck: Trachea normal and normal range of motion. Neck supple. Carotid bruit is not present. No mass and no thyromegaly present.  Cardiovascular: Normal rate, regular rhythm, S1 normal, S2 normal, normal heart sounds, intact distal pulses and normal pulses.  Exam reveals no gallop and no friction rub.   No murmur heard. Pulmonary/Chest: Effort normal and breath sounds normal. Not tachypneic. No respiratory distress. She has no decreased breath sounds. She has no wheezes. She has no rhonchi. She has no rales.  Abdominal: Soft. Normal appearance and bowel sounds are normal. There is no tenderness.  Neurological: She is alert.  Skin: Skin is warm, dry and intact. No rash noted.  Left breast lesion: erythema mild , 1 ich diameter swelling, minimal fluctuance, tender to palpation  Psychiatric: Her speech is normal and behavior is normal. Judgment and thought content normal. Her mood appears not anxious. Cognition and memory are normal. She does not exhibit a depressed mood.          Assessment & Plan:

## 2013-09-01 NOTE — Patient Instructions (Addendum)
Start antibiotics. Warm compresses on left breast 2-3 times daily. Do bleach baths, sanitize bathroom supplies,wash clothes in hot water if able. Use antibacterial soap like Dial or Lever 2000.

## 2013-10-08 LAB — HM MAMMOGRAPHY

## 2014-02-02 ENCOUNTER — Telehealth: Payer: Self-pay | Admitting: Family Medicine

## 2014-02-02 ENCOUNTER — Ambulatory Visit: Payer: Managed Care, Other (non HMO) | Admitting: Family Medicine

## 2014-02-02 NOTE — Telephone Encounter (Signed)
Patient Information:  Caller Name: Zelda  Phone: [phone]  Patient: [name] [name], [name] [name]  Gender: Female  DOB: Dec 02, 1960  Age: 53 Years  PCP: Eliezer Lofts (Family Practice)  Pregnant: No  Office Follow Up:  Does the office need to follow up with this patient?: No  Instructions For The Office: N/A  RN Note:  No appts available at the office today and unable to contact anyone at the office. Advised pt to go to UC. She stated that she will go to an UC after she takes son to the airport.  Symptoms  Reason For Call & Symptoms: Patient calling about feeling lightheaded,sli confused,cold,off balance,nauseous and BP today 87/58 & 92/58. Doesn't eat well or drink much and feels dehydrated--has felt this way for 1 month but worse today. Drinking Gatorade and laying in bed but still feeling lightheaded. Pt states that she has to drive her son to the airport in [place] today because he doesn't have anyone else to get him there.  Reviewed Health History In EMR: Yes  Reviewed Medications In EMR: Yes  Reviewed Allergies In EMR: Yes  Reviewed Surgeries / Procedures: Yes  Date of Onset of Symptoms: 01/05/2014  Treatments Tried: Gatorade  Treatments Tried Worked: No OB / GYN:  LMP: Unknown  Guideline(s) Used:  Dizziness  Disposition Per Guideline:   Go to Office Now  Reason For Disposition Reached:   Lightheadedness (dizziness) present now, after 2 hours of rest and fluids  Advice Given:  N/A  Patient Will Follow Care Advice:  YES

## 2014-02-02 NOTE — Telephone Encounter (Signed)
Noted. Pt to be seen at urgent care.

## 2014-02-04 LAB — HM PAP SMEAR: HM Pap smear: NORMAL

## 2014-02-09 ENCOUNTER — Ambulatory Visit (INDEPENDENT_AMBULATORY_CARE_PROVIDER_SITE_OTHER): Payer: Managed Care, Other (non HMO) | Admitting: Family Medicine

## 2014-02-09 ENCOUNTER — Encounter: Payer: Self-pay | Admitting: Family Medicine

## 2014-02-09 VITALS — BP 96/62 | HR 54 | Temp 98.1°F | Ht 66.0 in | Wt 152.5 lb

## 2014-02-09 DIAGNOSIS — R5381 Other malaise: Secondary | ICD-10-CM

## 2014-02-09 DIAGNOSIS — R29818 Other symptoms and signs involving the nervous system: Secondary | ICD-10-CM

## 2014-02-09 DIAGNOSIS — R2689 Other abnormalities of gait and mobility: Secondary | ICD-10-CM

## 2014-02-09 DIAGNOSIS — R5383 Other fatigue: Secondary | ICD-10-CM | POA: Insufficient documentation

## 2014-02-09 HISTORY — DX: Other fatigue: R53.83

## 2014-02-09 LAB — CBC WITH DIFFERENTIAL/PLATELET
BASOS ABS: 0 10*3/uL (ref 0.0–0.1)
Basophils Relative: 0.4 % (ref 0.0–3.0)
EOS PCT: 3.2 % (ref 0.0–5.0)
Eosinophils Absolute: 0.2 10*3/uL (ref 0.0–0.7)
HEMATOCRIT: 37.2 % (ref 36.0–46.0)
Hemoglobin: 12.5 g/dL (ref 12.0–15.0)
LYMPHS ABS: 2.2 10*3/uL (ref 0.7–4.0)
Lymphocytes Relative: 45.5 % (ref 12.0–46.0)
MCHC: 33.5 g/dL (ref 30.0–36.0)
MCV: 90.3 fl (ref 78.0–100.0)
Monocytes Absolute: 0.3 10*3/uL (ref 0.1–1.0)
Monocytes Relative: 6.9 % (ref 3.0–12.0)
NEUTROS ABS: 2.1 10*3/uL (ref 1.4–7.7)
Neutrophils Relative %: 44 % (ref 43.0–77.0)
Platelets: 303 10*3/uL (ref 150.0–400.0)
RBC: 4.12 Mil/uL (ref 3.87–5.11)
RDW: 13.5 % (ref 11.5–15.5)
WBC: 4.9 10*3/uL (ref 4.0–10.5)

## 2014-02-09 LAB — VITAMIN B12: VITAMIN B 12: 548 pg/mL (ref 211–911)

## 2014-02-09 LAB — SEDIMENTATION RATE: Sed Rate: 16 mm/hr (ref 0–22)

## 2014-02-09 NOTE — Progress Notes (Signed)
Pre visit review using our clinic review tool, if applicable. No additional management support is needed unless otherwise documented below in the visit note. 

## 2014-02-09 NOTE — Assessment & Plan Note (Signed)
Nml cbc, CMET, upreg and tsh at GYN recently.  Will eval vit D, vit B12, and for autoimmune disorder

## 2014-02-09 NOTE — Assessment & Plan Note (Signed)
Not clearly BBPV, negative [facility].  More balance issues but cerebellar testing nml today.  Worsening... eval with labs, consdier imaging of brain if further blood tests nml.

## 2014-02-09 NOTE — Patient Instructions (Signed)
We will call with lab results   

## 2014-02-09 NOTE — Progress Notes (Signed)
Subjective:    Patient ID: [name] [name], female    DOB: 1961/07/14, 53 y.o.   MRN: [id number]  HPI 53 year old female presents with  new onset vertigo 8-9 week ago. Started mild and has worsened gradually.. Feels off balance but not room spinning or presyncope with moving rapidly. Wakes up in morning with vertigo.  She has noted sometimes she veers off to one side, hits door frame etc. Associated with nausea. Improves with sitting still.  Has tried nausea pill, ibuprofen.   In last 8-9 weeks she has been having more fatigue during the day. Cannot make it to end of the day..  She sleeps a lot but not soundly.   She has been feeling hot lately, occ shortness of breath at rest. No chest pain No rash, no unexpected weight loss., no heart racing. No  Depression or anxiety. No new neuro symptoms (no blurred vision, no weakness but in last year she feels she drops things a lot x 1 year, no numbness), no current headache.   Had recent blood work at [facility], CMET and TSH was normal.   Her BP has been running low at home lately 85/60. On no BP medication. BP Readings from Last 3 Encounters:  02/09/14 96/62  09/01/13 100/68  07/27/13 96/60   She has history of vertigo.   Family history : sister with immune system issue.  Review of Systems  Constitutional: Positive for fatigue. Negative for fever.  HENT: Negative for ear pain.   Eyes: Negative for pain.  Respiratory: Negative for chest tightness.   Cardiovascular: Negative for chest pain, palpitations and leg swelling.  Gastrointestinal: Negative for abdominal pain.  Genitourinary: Negative for dysuria.       Objective:   Physical Exam  Constitutional: She is oriented to person, place, and time. Vital signs are normal. She appears well-developed and well-nourished. She is cooperative.  Non-toxic appearance. She does not appear ill. No distress.  HENT:  Head: Normocephalic.  Right Ear: Hearing, tympanic membrane,  external ear and ear canal normal. Tympanic membrane is not erythematous, not retracted and not bulging.  Left Ear: Hearing, tympanic membrane, external ear and ear canal normal. Tympanic membrane is not erythematous, not retracted and not bulging.  Nose: No mucosal edema or rhinorrhea. Right sinus exhibits no maxillary sinus tenderness and no frontal sinus tenderness. Left sinus exhibits no maxillary sinus tenderness and no frontal sinus tenderness.  Mouth/Throat: Uvula is midline, oropharynx is clear and moist and mucous membranes are normal.  Eyes: Conjunctivae, EOM and lids are normal. Pupils are equal, round, and reactive to light. Lids are everted and swept, no foreign bodies found.  Neck: Trachea normal and normal range of motion. Neck supple. Carotid bruit is not present. No mass and no thyromegaly present.  Cardiovascular: Normal rate, regular rhythm, S1 normal, S2 normal, normal heart sounds, intact distal pulses and normal pulses.  Exam reveals no gallop and no friction rub.   No murmur heard. Pulmonary/Chest: Effort normal and breath sounds normal. Not tachypneic. No respiratory distress. She has no decreased breath sounds. She has no wheezes. She has no rhonchi. She has no rales.  Abdominal: Soft. Normal appearance and bowel sounds are normal. There is no tenderness.  Neurological: She is alert and oriented to person, place, and time. She has normal strength and normal reflexes. No cranial nerve deficit or sensory deficit. She exhibits normal muscle tone. She displays a negative Romberg sign. Coordination and gait normal. GCS eye  subscore is 4. GCS verbal subscore is 5. GCS motor subscore is 6.  Nml cerebellar exam   No papilledema  Skin: Skin is warm, dry and intact. No rash noted.  Psychiatric: She has a normal mood and affect. Her speech is normal and behavior is normal. Judgment and thought content normal. Her mood appears not anxious. Cognition and memory are normal. Cognition and  memory are not impaired. She does not exhibit a depressed mood. She exhibits normal recent memory and normal remote memory.          Assessment & Plan:

## 2014-02-10 ENCOUNTER — Telehealth: Payer: Self-pay | Admitting: *Deleted

## 2014-02-10 LAB — VITAMIN D 25 HYDROXY (VIT D DEFICIENCY, FRACTURES): Vit D, 25-Hydroxy: 14 ng/mL — ABNORMAL LOW (ref 30–89)

## 2014-02-10 LAB — ANA: Anti Nuclear Antibody(ANA): POSITIVE — AB

## 2014-02-10 LAB — ANTI-NUCLEAR AB-TITER (ANA TITER)

## 2014-02-10 LAB — B. BURGDORFI ANTIBODIES: B BURGDORFERI AB IGG+ IGM: 0.44 {ISR}

## 2014-02-10 NOTE — Telephone Encounter (Signed)
Ms. Suleiman left voicemail on my phone requesting lab results.  She may be reached at [phone].

## 2014-02-11 ENCOUNTER — Telehealth: Payer: Self-pay | Admitting: Family Medicine

## 2014-02-11 MED ORDER — VITAMIN D (ERGOCALCIFEROL) 1.25 MG (50000 UNIT) PO CAPS: 50000.0000 [IU] | ORAL_CAPSULE | ORAL | Status: DC

## 2014-02-11 NOTE — Telephone Encounter (Signed)
Notified pt  ANA only weakly positive, likley not significant. Sed rate nml. Vit D low.. Will replete. Follow up if not improving. Pt agreeable with plan.

## 2014-02-12 NOTE — Telephone Encounter (Signed)
Dr. Diona Browner called and discussed lab results with Ms. Coger on 02/11/2014.

## 2014-02-15 ENCOUNTER — Telehealth: Payer: Self-pay

## 2014-02-15 DIAGNOSIS — R5383 Other fatigue: Principal | ICD-10-CM

## 2014-02-15 DIAGNOSIS — R5381 Other malaise: Secondary | ICD-10-CM

## 2014-02-15 DIAGNOSIS — R768 Other specified abnormal immunological findings in serum: Secondary | ICD-10-CM

## 2014-02-15 NOTE — Telephone Encounter (Signed)
Called pt.. No answer.  left message.  Recommend  referral to rheum given pt concerns.  referral ordered.

## 2014-02-15 NOTE — Telephone Encounter (Signed)
Pt request cb from Dr Diona Browner; pt said she is concerned about her health, ANA test was positive but Dr Diona Browner was not concerned because pt has not had joint pain; pt said she has not had joint pain for last 2 weeks but over the past several years pt has joint pain that goes away with ibuprofen. Pt saw ortho about rt thumb being swollen; pt did not take injection in thumb; pt said rt thumb has a knot on thumb. Pt wants Dr Diona Browner to know she is concerned about positive ANA due to pts hx, family hx and pt is having a lot of fatigue as well. Pt request cb.

## 2014-02-15 NOTE — Telephone Encounter (Signed)
[name] [name] notified that Dr. Diona Browner recommends referral to Rheumatology given her concerns.  Advised Rosaria Ferries or Vaughan Basta would be in contact with her once they get this appointment scheduled for her.  Patient in agreement with plan.

## 2014-03-10 ENCOUNTER — Other Ambulatory Visit: Payer: Self-pay | Admitting: Family Medicine

## 2014-03-23 ENCOUNTER — Encounter: Payer: Self-pay | Admitting: Family Medicine

## 2014-03-23 ENCOUNTER — Ambulatory Visit (INDEPENDENT_AMBULATORY_CARE_PROVIDER_SITE_OTHER): Payer: Managed Care, Other (non HMO) | Admitting: Family Medicine

## 2014-03-23 VITALS — BP 102/72 | HR 63 | Temp 97.8°F | Ht 64.25 in | Wt 154.2 lb

## 2014-03-23 DIAGNOSIS — R5381 Other malaise: Secondary | ICD-10-CM

## 2014-03-23 DIAGNOSIS — R5383 Other fatigue: Secondary | ICD-10-CM

## 2014-03-23 DIAGNOSIS — R635 Abnormal weight gain: Secondary | ICD-10-CM

## 2014-03-23 LAB — T3, FREE: T3 FREE: 2.4 pg/mL (ref 2.3–4.2)

## 2014-03-23 LAB — TSH: TSH: 1.06 u[IU]/mL (ref 0.35–4.50)

## 2014-03-23 LAB — T4, FREE: Free T4: 0.66 ng/dL (ref 0.60–1.60)

## 2014-03-23 MED ORDER — VITAMIN D (ERGOCALCIFEROL) 1.25 MG (50000 UNIT) PO CAPS: 50000.0000 [IU] | ORAL_CAPSULE | ORAL | Status: DC

## 2014-03-23 NOTE — Progress Notes (Signed)
   Subjective:    Patient ID: [name] [name], female    DOB: 09/28/1961, 53 y.o.   MRN: [id number]  HPI 53 year old female presents for unexplained weight GAIN.  She reports that in last 4 weeks she has been walking daily.  She is drinking water, eating moderately healthfully.  Wt Readings from Last 3 Encounters:  03/23/14 154 lb 4 oz (69.967 kg)  02/09/14 152 lb 8 oz (69.174 kg)  09/01/13 147 lb (66.679 kg)   She has regular menses but last menses was light. Fatigue is improved some with exercise and vit d. Good energy during the day but very tired at night.  Her balance issues have resolved.   TSH 2.0 02/25/2014    BP Readings from Last 3 Encounters:  03/23/14 102/72  02/09/14 96/62  09/01/13 100/68      Review of Systems     .    Physical Exam  Constitutional: Vital signs are normal. She appears well-developed and well-nourished. She is cooperative.  Non-toxic appearance. She does not appear ill. No distress.  HENT:  Head: Normocephalic.  Right Ear: Hearing, tympanic membrane, external ear and ear canal normal. Tympanic membrane is not erythematous, not retracted and not bulging.  Left Ear: Hearing, tympanic membrane, external ear and ear canal normal. Tympanic membrane is not erythematous, not retracted and not bulging.  Nose: No mucosal edema or rhinorrhea. Right sinus exhibits no maxillary sinus tenderness and no frontal sinus tenderness. Left sinus exhibits no maxillary sinus tenderness and no frontal sinus tenderness.  Mouth/Throat: Uvula is midline, oropharynx is clear and moist and mucous membranes are normal.  Eyes: Conjunctivae, EOM and lids are normal. Pupils are equal, round, and reactive to light. Lids are everted and swept, no foreign bodies found.  Neck: Trachea normal and normal range of motion. Neck supple. Carotid bruit is not present. No mass and no thyromegaly present.  Cardiovascular: Normal rate, regular rhythm, S1 normal, S2 normal, normal heart  sounds, intact distal pulses and normal pulses.  Exam reveals no gallop and no friction rub.   No murmur heard. Pulmonary/Chest: Effort normal and breath sounds normal. Not tachypneic. No respiratory distress. She has no decreased breath sounds. She has no wheezes. She has no rhonchi. She has no rales.  Abdominal: Soft. Normal appearance and bowel sounds are normal. There is no tenderness.  Neurological: She is alert.  Skin: Skin is warm, dry and intact. No rash noted.  Psychiatric: Her speech is normal and behavior is normal. Judgment and thought content normal. Her mood appears not anxious. Cognition and memory are normal. She does not exhibit a depressed mood.          Assessment & Plan:

## 2014-03-23 NOTE — Assessment & Plan Note (Signed)
Likely decreasing metabolism due to age but will eval with labs

## 2014-03-23 NOTE — Assessment & Plan Note (Signed)
Improved with exercise and vit d replacement

## 2014-03-23 NOTE — Patient Instructions (Signed)
Keep working on healthy eating and exercise. Consider low carb options but make sure getting enough protein and fiber.  Stop at lab on way out.

## 2014-03-23 NOTE — Progress Notes (Signed)
Pre visit review using our clinic review tool, if applicable. No additional management support is needed unless otherwise documented below in the visit note. 

## 2014-03-24 ENCOUNTER — Encounter: Payer: Self-pay | Admitting: *Deleted

## 2014-03-24 LAB — CORTISOL-AM, BLOOD: Cortisol - AM: 9 ug/dL (ref 4.3–22.4)

## 2014-03-25 ENCOUNTER — Encounter: Payer: Self-pay | Admitting: Family Medicine

## 2014-04-08 ENCOUNTER — Telehealth: Payer: Self-pay | Admitting: Family Medicine

## 2014-04-08 DIAGNOSIS — E785 Hyperlipidemia, unspecified: Secondary | ICD-10-CM

## 2014-04-08 NOTE — Telephone Encounter (Signed)
Message copied by Jinny Sanders on Thu Apr 08, 2014  8:25 AM ------      Message from: Ellamae Sia      Created: Tue Apr 06, 2014  9:55 AM      Regarding: Lab orders for Thursday, 7.2.15       Patient is scheduled for CPX labs, please order future labs, Thanks , Terri       ------

## 2014-04-12 ENCOUNTER — Other Ambulatory Visit (INDEPENDENT_AMBULATORY_CARE_PROVIDER_SITE_OTHER): Payer: Managed Care, Other (non HMO)

## 2014-04-12 DIAGNOSIS — E785 Hyperlipidemia, unspecified: Secondary | ICD-10-CM

## 2014-04-12 LAB — COMPREHENSIVE METABOLIC PANEL
ALT: 12 U/L (ref 0–35)
AST: 20 U/L (ref 0–37)
Albumin: 3.8 g/dL (ref 3.5–5.2)
Alkaline Phosphatase: 59 U/L (ref 39–117)
BILIRUBIN TOTAL: 0.4 mg/dL (ref 0.2–1.2)
BUN: 9 mg/dL (ref 6–23)
CALCIUM: 9.1 mg/dL (ref 8.4–10.5)
CO2: 26 meq/L (ref 19–32)
CREATININE: 0.9 mg/dL (ref 0.4–1.2)
Chloride: 104 mEq/L (ref 96–112)
GFR: 86.62 mL/min (ref >60.00)
Glucose, Bld: 110 mg/dL — ABNORMAL HIGH (ref 70–99)
Potassium: 3.8 mEq/L (ref 3.5–5.1)
Sodium: 136 mEq/L (ref 135–145)
Total Protein: 7 g/dL (ref 6.0–8.3)

## 2014-04-12 LAB — LIPID PANEL
Cholesterol: 187 mg/dL (ref 0–200)
HDL: 47.2 mg/dL (ref >39.00)
LDL Cholesterol: 93 mg/dL (ref 0–99)
NonHDL: 139.8
TRIGLYCERIDES: 232 mg/dL — AB (ref 0.0–149.0)
Total CHOL/HDL Ratio: 4
VLDL: 46.4 mg/dL — ABNORMAL HIGH (ref 0.0–40.0)

## 2014-04-20 ENCOUNTER — Encounter (INDEPENDENT_AMBULATORY_CARE_PROVIDER_SITE_OTHER): Payer: Self-pay

## 2014-04-20 ENCOUNTER — Encounter: Payer: Self-pay | Admitting: Family Medicine

## 2014-04-20 ENCOUNTER — Ambulatory Visit (INDEPENDENT_AMBULATORY_CARE_PROVIDER_SITE_OTHER): Payer: Managed Care, Other (non HMO) | Admitting: Family Medicine

## 2014-04-20 VITALS — BP 90/60 | HR 57 | Temp 98.2°F | Ht 65.0 in | Wt 151.5 lb

## 2014-04-20 DIAGNOSIS — R7309 Other abnormal glucose: Secondary | ICD-10-CM

## 2014-04-20 DIAGNOSIS — R7303 Prediabetes: Secondary | ICD-10-CM

## 2014-04-20 DIAGNOSIS — E785 Hyperlipidemia, unspecified: Secondary | ICD-10-CM

## 2014-04-20 DIAGNOSIS — R635 Abnormal weight gain: Secondary | ICD-10-CM

## 2014-04-20 MED ORDER — VITAMIN D (ERGOCALCIFEROL) 1.25 MG (50000 UNIT) PO CAPS: 50000.0000 [IU] | ORAL_CAPSULE | ORAL | Status: DC

## 2014-04-20 NOTE — Progress Notes (Signed)
Pre visit review using our clinic review tool, if applicable. No additional management support is needed unless otherwise documented below in the visit note. 

## 2014-04-20 NOTE — Patient Instructions (Signed)
Decrease sugar intake ( ie watermelon).

## 2014-04-20 NOTE — Assessment & Plan Note (Signed)
Likely due ot increase in watermelon and sugar. Counseled on diet change.  She will restart exercsie as knee is improved.

## 2014-04-20 NOTE — Assessment & Plan Note (Signed)
LDL at goal. Trig high, likely due to increase in watermelon.

## 2014-04-20 NOTE — Progress Notes (Signed)
Subjective:    Patient ID: [name] [name], female    DOB: 07/05/1961, 53 y.o.   MRN: [id number]  HPI 53 year old female presents for follow up of chronic health issues.  Seeing GYN Dr. Ulanda Edison: Have appt for pap next week.  Elevated Cholesterol: Excellent control LDL on no med, but elevated trig. Lab Results  Component Value Date   CHOL 187 04/12/2014   HDL 47.20 04/12/2014   LDLCALC 93 04/12/2014   LDLDIRECT 126.6 12/22/2012   TRIG 232.0* 04/12/2014   CHOLHDL 4 04/12/2014  Using medications without problems: Muscle aches:  Diet compliance: She has been eating a lot of watermelon. Exercise: She has had to decrease exercise given left knee pain. S/p cortisone injection. Other complaints:  Abnormal weight gain.Marland Kitchen nml TSH, nml AM cortisol. Wt Readings from Last 3 Encounters:  04/20/14 151 lb 8 oz (68.72 kg)  03/23/14 154 lb 4 oz (69.967 kg)  02/09/14 152 lb 8 oz (69.174 kg)   Prediabetes: new onset. Likely due to diet change.  BP Readings from Last 3 Encounters:  04/20/14 90/60  03/23/14 102/72  02/09/14 96/62       Review of Systems  Constitutional: Negative for fever and fatigue.  HENT: Negative for ear pain.   Eyes: Negative for pain.  Respiratory: Negative for chest tightness and shortness of breath.   Cardiovascular: Negative for chest pain, palpitations and leg swelling.  Gastrointestinal: Negative for abdominal pain.  Genitourinary: Negative for dysuria.       Objective:   Physical Exam  Constitutional: Vital signs are normal. She appears well-developed and well-nourished. She is cooperative.  Non-toxic appearance. She does not appear ill. No distress.  HENT:  Head: Normocephalic.  Right Ear: Hearing, tympanic membrane, external ear and ear canal normal.  Left Ear: Hearing, tympanic membrane, external ear and ear canal normal.  Nose: Nose normal.  Eyes: Conjunctivae, EOM and lids are normal. Pupils are equal, round, and reactive to light. Lids are everted and  swept, no foreign bodies found.  Neck: Trachea normal and normal range of motion. Neck supple. Carotid bruit is not present. No mass and no thyromegaly present.  Cardiovascular: Normal rate, regular rhythm, S1 normal, S2 normal, normal heart sounds and intact distal pulses.  Exam reveals no gallop.   No murmur heard. Pulmonary/Chest: Effort normal and breath sounds normal. No respiratory distress. She has no wheezes. She has no rhonchi. She has no rales.  Abdominal: Soft. Normal appearance and bowel sounds are normal. She exhibits no distension, no fluid wave, no abdominal bruit and no mass. There is no hepatosplenomegaly. There is no tenderness. There is no rebound, no guarding and no CVA tenderness. No hernia.  Lymphadenopathy:    She has no cervical adenopathy.    She has no axillary adenopathy.  Neurological: She is alert. She has normal strength. No cranial nerve deficit or sensory deficit.  Skin: Skin is warm, dry and intact. No rash noted.  Psychiatric: Her speech is normal and behavior is normal. Judgment normal. Her mood appears not anxious. Cognition and memory are normal. She does not exhibit a depressed mood.          Assessment & Plan:  The patient's preventative maintenance and recommended screening tests for an annual wellness exam were reviewed in full today. Brought up to date unless services declined.  Counselled on the importance of diet, exercise, and its role in overall health and mortality. The patient's FH and SH was reviewed, including their home  life, tobacco status, and drug and alcohol status.   Colon: 2014  3 polyps,repeat in 10 years Dr. Fuller Plan PAP/DVE/ mammo: Dr. Daiva Huge Td uptodate.

## 2014-07-25 ENCOUNTER — Other Ambulatory Visit: Payer: Self-pay | Admitting: Family Medicine

## 2014-07-25 NOTE — Telephone Encounter (Signed)
Last office visit 04/20/2014.  Last refilled 04/20/2014 for #8 with no refills.  Ok to refill?

## 2014-10-04 ENCOUNTER — Ambulatory Visit (INDEPENDENT_AMBULATORY_CARE_PROVIDER_SITE_OTHER): Payer: Managed Care, Other (non HMO) | Admitting: Internal Medicine

## 2014-10-04 ENCOUNTER — Encounter: Payer: Self-pay | Admitting: Internal Medicine

## 2014-10-04 VITALS — BP 108/64 | HR 71 | Temp 97.9°F | Wt 150.5 lb

## 2014-10-04 DIAGNOSIS — N390 Urinary tract infection, site not specified: Secondary | ICD-10-CM

## 2014-10-04 DIAGNOSIS — N393 Stress incontinence (female) (male): Secondary | ICD-10-CM

## 2014-10-04 DIAGNOSIS — R42 Dizziness and giddiness: Secondary | ICD-10-CM

## 2014-10-04 DIAGNOSIS — R110 Nausea: Secondary | ICD-10-CM

## 2014-10-04 LAB — POCT URINALYSIS DIPSTICK
BILIRUBIN UA: NEGATIVE
Blood, UA: NEGATIVE
GLUCOSE UA: NEGATIVE
Leukocytes, UA: NEGATIVE
NITRITE UA: NEGATIVE
PH UA: 5.5
PROTEIN UA: NEGATIVE
Urobilinogen, UA: NEGATIVE

## 2014-10-04 MED ORDER — MECLIZINE HCL 50 MG PO TABS: 25.0000 mg | ORAL_TABLET | Freq: Three times a day (TID) | ORAL | Status: DC | PRN

## 2014-10-04 NOTE — Progress Notes (Signed)
Subjective:    Patient ID: [name] [name], female    DOB: 1961/07/12, 53 y.o.   MRN: [id number]  HPI  Pt presents to the clinic today with multiple complaints.   1- She has felt dizzy intermittently over the last 8 days. She has also felt drowsy and nauseated at times. [facility] the same. She was advised to take Dramamine and Claritin D but has not taken that in the last 2 days. She does not feel like the medication has helped. She reports that this is the same type of dizziness she was seen back in 02/2014. She reports that it feels like her balance is off. It is worse with movement. She does not feel like the room is spinning. She denies chest pain or shortness of breath.  2- She is having trouble holding her urine. She noticed this about 6 months ago. She is having to wear pads now. It seems to be worse with coughing or sneezing. [facility], they told her that she had bacteria in her urine. She was given Macrobidc. She has felt a little bit better since that time.   Review of Systems      No past medical history on file.  Current Outpatient Prescriptions  Medication Sig Dispense Refill  . fluocinonide (LIDEX) 0.05 % external solution Apply 1 application topically 2 (two) times daily.    . Vitamin D, Ergocalciferol, (DRISDOL) 50000 UNITS CAPS capsule TAKE 1 CAPSULE (50,000 UNITS TOTAL) BY MOUTH EVERY 7 (SEVEN) DAYS. 8 capsule 0   No current facility-administered medications for this visit.    Allergies  Allergen Reactions  . Doxycycline   . Sulfa Antibiotics     Family History  Problem Relation Age of Onset  . Cancer Mother     lung cancer age 57, colon cancer age 80  . Hypertension Mother   . Colon cancer Mother   . Heart disease Father   . COPD Father   . Stroke Sister   . Heart disease Sister   . Stroke Sister   . Heart disease Sister   . Colon cancer Maternal Aunt   . Esophageal cancer Neg Hx   . Stomach cancer Neg  Hx   . Rectal cancer Neg Hx     History   Social History  . Marital Status: Single    Spouse Name: N/A    Number of Children: 1  . Years of Education: N/A   Occupational History  . Mortgages    Social History Main Topics  . Smoking status: Former Smoker    Types: Cigarettes  . Smokeless tobacco: Never Used     Comment: only smokes a few days a week  . Alcohol Use: Yes     Comment: very, very rarely  . Drug Use: No  . Sexual Activity: No   Other Topics Concern  . Not on file   Social History Narrative     Constitutional: Pt reports fatigue. Denies fever, malaise, fatigue, headache or abrupt weight changes.  HEENT: Denies eye pain, eye redness, ear pain, ringing in the ears, wax buildup, runny nose, nasal congestion, bloody nose, or sore throat. Respiratory: Denies difficulty breathing, shortness of breath, cough or sputum production.   Cardiovascular: Denies chest pain, chest tightness, palpitations or swelling in the hands or feet.  Gastrointestinal: Pt reports nausea. Denies abdominal pain, bloating, constipation, diarrhea or blood in the stool.  GU: Pt reports incontinence.  Denies urgency, frequency, pain with urination, burning sensation, blood in urine, odor or discharge. Neurological: Pt reports dizziness. Denies difficulty with memory, difficulty with speech or problems with balance and coordination.   No other specific complaints in a complete review of systems (except as listed in HPI above).  Objective:   Physical Exam   BP 108/64 mmHg  Pulse 71  Temp(Src) 97.9 F (36.6 C) (Oral)  Wt 150 lb 8 oz (68.266 kg)  SpO2 99% Wt Readings from Last 3 Encounters:  10/04/14 150 lb 8 oz (68.266 kg)  04/20/14 151 lb 8 oz (68.72 kg)  03/23/14 154 lb 4 oz (69.967 kg)    General: Appears her stated age, well developed, well nourished in NAD. Skin: Warm, dry and intact. No rashes, lesions or ulcerations noted. HEENT: Head: normal shape and size; Eyes: sclera white,  no icterus, conjunctiva pink; Ears: Tm's gray and intact, normal light reflex; Nose: mucosa pink and moist, septum midline; Throat/Mouth: Teeth present, mucosa pink and moist, no exudate, lesions or ulcerations noted.   Cardiovascular: Normal rate and rhythm. S1,S2 noted.  No murmur, rubs or gallops noted.  Pulmonary/Chest: Normal effort and positive vesicular breath sounds. No respiratory distress. No wheezes, rales or ronchi noted.  Abdomen: Soft and nontender. Normal bowel sounds, no bruits noted. No distention or masses noted. Liver, spleen and kidneys non palpable. Neurological: Alert and oriented. Cranial nerves II-XII grossly intact. Coordination normal.    BMET    Component Value Date/Time   NA 136 04/12/2014 0826   K 3.8 04/12/2014 0826   CL 104 04/12/2014 0826   CO2 26 04/12/2014 0826   GLUCOSE 110* 04/12/2014 0826   BUN 9 04/12/2014 0826   CREATININE 0.9 04/12/2014 0826   CALCIUM 9.1 04/12/2014 0826   GFRNONAA 82 04/30/2008 0856   GFRAA 99 04/30/2008 0856    Lipid Panel     Component Value Date/Time   CHOL 187 04/12/2014 0826   TRIG 232.0* 04/12/2014 0826   HDL 47.20 04/12/2014 0826   CHOLHDL 4 04/12/2014 0826   VLDL 46.4* 04/12/2014 0826   LDLCALC 93 04/12/2014 0826    CBC    Component Value Date/Time   WBC 4.9 02/09/2014 1035   RBC 4.12 02/09/2014 1035   HGB 12.5 02/09/2014 1035   HCT 37.2 02/09/2014 1035   PLT 303.0 02/09/2014 1035   MCV 90.3 02/09/2014 1035   MCHC 33.5 02/09/2014 1035   RDW 13.5 02/09/2014 1035   LYMPHSABS 2.2 02/09/2014 1035   MONOABS 0.3 02/09/2014 1035   EOSABS 0.2 02/09/2014 1035   BASOSABS 0.0 02/09/2014 1035    Hgb A1C No results found for: HGBA1C      Assessment & Plan:   Dizziness:  Sounds like BPPV but had a negative dixhallpike in the past eRx for Meclizine 25 mg PO TID prn Push fluids  UTI:  No [facility] from [facility] for me to review She has not heard back about the urine culture Continue Macrobid for  now  Stress incontinence:  Advised her to try Kegel exercise Advised her to go to the bathroom every 2-3 hours She is not interested in starting medication at this time  Follow up with PCP in 1 week if symptoms persist

## 2014-10-04 NOTE — Patient Instructions (Signed)

## 2014-10-04 NOTE — Progress Notes (Signed)
Pre visit review using our clinic review tool, if applicable. No additional management support is needed unless otherwise documented below in the visit note. 

## 2014-10-07 ENCOUNTER — Ambulatory Visit (INDEPENDENT_AMBULATORY_CARE_PROVIDER_SITE_OTHER): Payer: Managed Care, Other (non HMO) | Admitting: Family Medicine

## 2014-10-07 ENCOUNTER — Encounter: Payer: Self-pay | Admitting: Family Medicine

## 2014-10-07 VITALS — BP 110/62 | HR 79 | Temp 98.2°F | Wt 149.8 lb

## 2014-10-07 DIAGNOSIS — R29818 Other symptoms and signs involving the nervous system: Secondary | ICD-10-CM

## 2014-10-07 DIAGNOSIS — R2689 Other abnormalities of gait and mobility: Secondary | ICD-10-CM

## 2014-10-07 DIAGNOSIS — J0100 Acute maxillary sinusitis, unspecified: Secondary | ICD-10-CM

## 2014-10-07 DIAGNOSIS — H6983 Other specified disorders of Eustachian tube, bilateral: Secondary | ICD-10-CM

## 2014-10-07 MED ORDER — AZITHROMYCIN 250 MG PO TABS: ORAL_TABLET | ORAL | Status: DC

## 2014-10-07 NOTE — Patient Instructions (Signed)
Start nasal OTC fluticasone. Can complete z-pak. Follow up in 1-2 weeks or call if not improving.

## 2014-10-07 NOTE — Progress Notes (Signed)
Pre visit review using our clinic review tool, if applicable. No additional management support is needed unless otherwise documented below in the visit note. 

## 2014-10-07 NOTE — Progress Notes (Signed)
Subjective:    Patient ID: [name] [name], female    DOB: [birth year], 53 y.o.   MRN: [id number]  HPI   54 year old female presents  For follow up on vertigo.  She has felt dizzy intermittently over the last 10 days . She has also felt drowsy and nauseated at times. [facility] the same. She was advised to take Dramamine and Claritin D but has not taken that in the last 2 days. She does not feel like the medication has helped. She reports that this is the same type of dizziness she was seen back in 02/2014 ( may have gotten better with treatment of low vit D). She reports that it feels like her balance is off. It is worse with movement. She does not feel like the room is spinning. She denies chest pain or shortness of breath.  Also given macrobid for possible [facility] symptoms [facility]. No clear known bladder infection improved. Ongoing x 8-9 months. No issues since going to bathroom every 2 hours and not holding urine.   She saw  Cecille Po for dizziness on 10/04/14  Felt it was  BPPV. Given meclizine. Did not help.  She continues to feel woozy. Not really room spinning but says feels like she is on a boat. Balance is off. Has head fullness. No nasal congestion. NO SOB. No fever, no headache. No vision change, no numbness, no weakness. She feels fatigued She is under moderate amount of stress.      Review of Systems  Constitutional: Negative for fever and fatigue.  HENT: Negative for ear pain.   Eyes: Negative for pain.  Respiratory: Negative for chest tightness and shortness of breath.   Cardiovascular: Negative for chest pain, palpitations and leg swelling.  Gastrointestinal: Negative for abdominal pain.  Genitourinary: Negative for dysuria.       Objective:   Physical Exam  Constitutional: Vital signs are normal. She appears well-developed and well-nourished. She is cooperative.  Non-toxic appearance. She does not appear  ill. No distress.  HENT:  Head: Normocephalic.  Right Ear: Hearing, external ear and ear canal normal. Tympanic membrane is not erythematous, not retracted and not bulging. A middle ear effusion is present.  Left Ear: Hearing, external ear and ear canal normal. Tympanic membrane is not erythematous, not retracted and not bulging. A middle ear effusion is present.  Nose: No mucosal edema or rhinorrhea. Right sinus exhibits no maxillary sinus tenderness and no frontal sinus tenderness. Left sinus exhibits no maxillary sinus tenderness and no frontal sinus tenderness.  Mouth/Throat: Uvula is midline, oropharynx is clear and moist and mucous membranes are normal.  Eyes: Conjunctivae, EOM and lids are normal. Pupils are equal, round, and reactive to light. Lids are everted and swept, no foreign bodies found.  Neck: Trachea normal and normal range of motion. Neck supple. Carotid bruit is not present. No thyroid mass and no thyromegaly present.  Cardiovascular: Normal rate, regular rhythm, S1 normal, S2 normal, normal heart sounds, intact distal pulses and normal pulses.  Exam reveals no gallop and no friction rub.   No murmur heard. Pulmonary/Chest: Effort normal and breath sounds normal. No tachypnea. No respiratory distress. She has no decreased breath sounds. She has no wheezes. She has no rhonchi. She has no rales.  Abdominal: Soft. Normal appearance and bowel sounds are normal. There is no tenderness.  Neurological: She is alert. No cranial nerve deficit or sensory deficit.  She displays a negative Romberg sign.  Neg dix hallpike  Skin: Skin is warm, dry and intact. No rash noted.  Psychiatric: Her speech is normal and behavior is normal. Judgment and thought content normal. Her mood appears not anxious. Cognition and memory are normal. She does not exhibit a depressed mood.          Assessment & Plan:  Concern for bacterial insus infection... ? Causing balanace issues. Also pt with eustacian  tube dysfunction. No clear BPPV per description of symptoms and on exam. No new neuro exam findings or red flags  Start nasal OTC fluticasone. Can complete z-pak. Follow up in 1-2 weeks or call if not improving.

## 2014-10-19 DIAGNOSIS — H6990 Unspecified Eustachian tube disorder, unspecified ear: Secondary | ICD-10-CM | POA: Insufficient documentation

## 2014-10-19 DIAGNOSIS — H6980 Other specified disorders of Eustachian tube, unspecified ear: Secondary | ICD-10-CM | POA: Insufficient documentation

## 2014-10-19 DIAGNOSIS — J0100 Acute maxillary sinusitis, unspecified: Secondary | ICD-10-CM | POA: Insufficient documentation

## 2014-10-19 DIAGNOSIS — R2689 Other abnormalities of gait and mobility: Secondary | ICD-10-CM | POA: Insufficient documentation

## 2014-11-29 ENCOUNTER — Other Ambulatory Visit: Payer: Self-pay | Admitting: Dermatology

## 2015-01-04 ENCOUNTER — Telehealth: Payer: Self-pay | Admitting: Family Medicine

## 2015-01-04 NOTE — Telephone Encounter (Signed)
Spoke with Ms. [name] [name].  Just states she was told she was denied based on [name] [name] notes in her records but was not given any details as to what it was.  She states she has filed an appeal and they will be sending that paperwork to [name] [name].

## 2015-01-04 NOTE — Telephone Encounter (Signed)
Please call pt to gather more info. I am not sure why it was denied.

## 2015-01-04 NOTE — Telephone Encounter (Signed)
Pt called wanting to speak to dr Diona Browner She stated she was denied disability insurance based on information in her file

## 2015-01-04 NOTE — Telephone Encounter (Signed)
Noted  

## 2015-02-01 ENCOUNTER — Telehealth: Payer: Self-pay | Admitting: *Deleted

## 2015-02-01 ENCOUNTER — Encounter: Payer: Self-pay | Admitting: Family Medicine

## 2015-02-01 NOTE — Telephone Encounter (Signed)
Patient called stating she was denied life insurance through [employer] based on information that was given from her PCP.  She has questions regarding this and would like a call back to discuss.

## 2015-02-02 NOTE — Telephone Encounter (Signed)
I will call pt when back in office on Fri.

## 2015-02-04 NOTE — Telephone Encounter (Signed)
Patient called and asked for Dr.Bedsole to call her.  Please call patient at [phone]. Patient wanted to make sure Dr.Bedsole would call her back today.

## 2015-02-04 NOTE — Telephone Encounter (Signed)
LMOM, called twice no answer. Will try again next week. Given cell phone number given difficulty reaching patient and pt concern level.

## 2015-02-04 NOTE — Telephone Encounter (Signed)
Pt called to discuss issue.  I will write a note explaining any concerns that the insurance had. Will send  a copy of last rheum note.

## 2015-02-08 ENCOUNTER — Encounter: Payer: Self-pay | Admitting: Family Medicine

## 2015-02-08 NOTE — Telephone Encounter (Signed)
Please let pt know letter is ready to pick up. I put letter in Donna's inbox

## 2015-02-09 NOTE — Telephone Encounter (Signed)
Left message for [name] [name] that her letter and last office note from Dr. Charlestine Night is ready to be picked up at the front desk.

## 2015-02-20 LAB — HM MAMMOGRAPHY: HM Mammogram: NORMAL

## 2015-04-08 ENCOUNTER — Other Ambulatory Visit: Payer: Self-pay | Admitting: Family Medicine

## 2015-04-08 MED ORDER — VITAMIN D (ERGOCALCIFEROL) 1.25 MG (50000 UNIT) PO CAPS: ORAL_CAPSULE | ORAL | Status: DC

## 2015-04-08 NOTE — Addendum Note (Signed)
Addended by: Carter Kitten on: 04/08/2015 05:07 PM   Modules accepted: Orders

## 2015-04-08 NOTE — Telephone Encounter (Signed)
OKay to refill.

## 2015-04-08 NOTE — Telephone Encounter (Signed)
Notify py I would recommend checking before repeating course to see if it is necessary.

## 2015-04-08 NOTE — Telephone Encounter (Signed)
Ms. Olivos notified as instructed by telephone.  She states she knows she needs another course of Vitamin D 50, 000 units due to her chronic fatigue.  She states she was not had any Vitamin D in about 2 to 3 months and can tell a big difference in her energy level.  She states she can hardly make it home from work at 5:30 due to being so tired.  She states she is currently in the bed right now.  Asking that Dr. Diona Browner refills the Vitamin D prior to her CPE in August.  Please Advise.

## 2015-04-08 NOTE — Telephone Encounter (Signed)
Last office visit 10/07/2014.  Last refilled 07/25/2014 for #8 with no refills.  Last vitamin D level checked 02/09/2014.  Refill?  Has CPE scheduled for 05/17/2015.

## 2015-04-08 NOTE — Telephone Encounter (Signed)
Ms. Burklow notified refill has been sent to her pharmacy.

## 2015-04-29 ENCOUNTER — Telehealth: Payer: Self-pay | Admitting: Family Medicine

## 2015-04-29 DIAGNOSIS — E785 Hyperlipidemia, unspecified: Secondary | ICD-10-CM

## 2015-04-29 DIAGNOSIS — R7303 Prediabetes: Secondary | ICD-10-CM

## 2015-04-29 NOTE — Telephone Encounter (Signed)
-----   Message from Ellamae Sia sent at 04/29/2015 11:02 AM EDT ----- Regarding: Lab orders for Monday,8.1.16 Patient is scheduled for CPX labs, please order future labs, Thanks , Karna Christmas

## 2015-05-06 ENCOUNTER — Telehealth: Payer: Self-pay

## 2015-05-06 DIAGNOSIS — G4730 Sleep apnea, unspecified: Secondary | ICD-10-CM

## 2015-05-06 NOTE — Telephone Encounter (Signed)
I am going to CC this to her PCP for the referral and I'm not sure if Dr Diona Browner will want to see her for this problem also before her aug appt (since it is a separate issue) - I will leave that up to her  Thanks for the heads up

## 2015-05-06 NOTE — Telephone Encounter (Signed)
Pt saw eye doctor 05/05/15 and pt has optic neuropathy;pressure in eye is low so does not think glaucoma; eye doctor advised pt to get sleep study done; thinks problem is medical.  Pt is concerned that nerves of her eye are being damaged. Pt has dizziness,wozziness on and off for one year; pt has seen Dr Diona Browner for these issues. Pt has CPX scheduled for 05/17/15 with Dr Diona Browner and appt for labs on 05/09/15. Pt wants to know if there are additional labs that needed to be added to the labs to be drawn on 05/09/15 due to her eye problem. Pt also request referral for sleep study to be done ASAP. Pt is in [place] today and pt cannot come in for appt. Pt request cb.

## 2015-05-09 ENCOUNTER — Other Ambulatory Visit (INDEPENDENT_AMBULATORY_CARE_PROVIDER_SITE_OTHER): Payer: Managed Care, Other (non HMO)

## 2015-05-09 DIAGNOSIS — R7309 Other abnormal glucose: Secondary | ICD-10-CM

## 2015-05-09 DIAGNOSIS — E785 Hyperlipidemia, unspecified: Secondary | ICD-10-CM | POA: Diagnosis not present

## 2015-05-09 DIAGNOSIS — R7303 Prediabetes: Secondary | ICD-10-CM

## 2015-05-09 LAB — COMPREHENSIVE METABOLIC PANEL
ALK PHOS: 59 U/L (ref 39–117)
ALT: 10 U/L (ref 0–35)
AST: 17 U/L (ref 0–37)
Albumin: 4.2 g/dL (ref 3.5–5.2)
BUN: 11 mg/dL (ref 6–23)
CO2: 25 mEq/L (ref 19–32)
CREATININE: 0.89 mg/dL (ref 0.40–1.20)
Calcium: 9.3 mg/dL (ref 8.4–10.5)
Chloride: 108 mEq/L (ref 96–112)
GFR: 85.15 mL/min (ref >60.00)
GLUCOSE: 85 mg/dL (ref 70–99)
POTASSIUM: 4.1 meq/L (ref 3.5–5.1)
SODIUM: 141 meq/L (ref 135–145)
Total Bilirubin: 0.4 mg/dL (ref 0.2–1.2)
Total Protein: 7.2 g/dL (ref 6.0–8.3)

## 2015-05-09 LAB — LIPID PANEL
CHOLESTEROL: 217 mg/dL — AB (ref 0–200)
HDL: 62.6 mg/dL (ref >39.00)
LDL CALC: 136 mg/dL — AB (ref 0–99)
NonHDL: 154.46
TRIGLYCERIDES: 93 mg/dL (ref 0.0–149.0)
Total CHOL/HDL Ratio: 3
VLDL: 18.6 mg/dL (ref 0.0–40.0)

## 2015-05-09 LAB — HEMOGLOBIN A1C: Hgb A1c MFr Bld: 5.4 % (ref 4.6–6.5)

## 2015-05-09 NOTE — Telephone Encounter (Signed)
Office notes received and placed in Dr. Rometta Emery in box for review.

## 2015-05-09 NOTE — Telephone Encounter (Signed)
Ms. Temkin notified as instructed by telephone.  Office notes requested from [facility].  She will await call from [facility] or Ebony Hail about her sleep study appointment.

## 2015-05-09 NOTE — Telephone Encounter (Signed)
I am not aware of other labs ( from this info at least that need to be ordered) Have her keep appt as scheduled and if needed we can add labs at that time.  I will send in referral for sleep apnea.   Please get records from eye MD with any of their recs for further eval.

## 2015-05-10 NOTE — Telephone Encounter (Signed)
Let pt know I will review upon my return and discuss further eval with her at upcoming appt if this is agreeable to her.

## 2015-05-10 NOTE — Telephone Encounter (Signed)
Patient is aware that Dr. Diona Browner is out of the office until next week and her eye exam records will be discussed at her office visit on 05/17/2015.

## 2015-05-12 ENCOUNTER — Encounter (INDEPENDENT_AMBULATORY_CARE_PROVIDER_SITE_OTHER): Payer: Self-pay

## 2015-05-12 ENCOUNTER — Encounter: Payer: Self-pay | Admitting: Internal Medicine

## 2015-05-12 ENCOUNTER — Ambulatory Visit (INDEPENDENT_AMBULATORY_CARE_PROVIDER_SITE_OTHER): Payer: Managed Care, Other (non HMO) | Admitting: Internal Medicine

## 2015-05-12 ENCOUNTER — Encounter: Payer: Self-pay | Admitting: *Deleted

## 2015-05-12 VITALS — BP 102/60 | HR 68 | Temp 97.7°F | Ht 66.0 in | Wt 150.0 lb

## 2015-05-12 DIAGNOSIS — G4719 Other hypersomnia: Secondary | ICD-10-CM

## 2015-05-12 DIAGNOSIS — R4189 Other symptoms and signs involving cognitive functions and awareness: Secondary | ICD-10-CM

## 2015-05-12 DIAGNOSIS — G479 Sleep disorder, unspecified: Secondary | ICD-10-CM

## 2015-05-12 DIAGNOSIS — K219 Gastro-esophageal reflux disease without esophagitis: Secondary | ICD-10-CM | POA: Diagnosis not present

## 2015-05-12 DIAGNOSIS — R0683 Snoring: Secondary | ICD-10-CM

## 2015-05-12 DIAGNOSIS — E785 Hyperlipidemia, unspecified: Secondary | ICD-10-CM | POA: Diagnosis not present

## 2015-05-12 DIAGNOSIS — R29818 Other symptoms and signs involving the nervous system: Secondary | ICD-10-CM | POA: Diagnosis not present

## 2015-05-12 DIAGNOSIS — G478 Other sleep disorders: Secondary | ICD-10-CM

## 2015-05-12 DIAGNOSIS — R2689 Other abnormalities of gait and mobility: Secondary | ICD-10-CM

## 2015-05-12 DIAGNOSIS — R7309 Other abnormal glucose: Secondary | ICD-10-CM

## 2015-05-12 DIAGNOSIS — R7303 Prediabetes: Secondary | ICD-10-CM

## 2015-05-12 NOTE — Progress Notes (Signed)
[facility] Consultation      Assessment and Plan:  Excessive daytime sleepiness -Excessive sleepiness during the day, memory impairment despite adequate sleep time at night without any significant medication usage, which could be making her sleepy. Appears to be suspicious for obstructive sleep apnea. Will send for sleep study.  GERD -This may be exacerbated by the presence of sleep apnea if present  Prediabetes -Sleep apnea can worsen diabetes.  Snoring --Snores loudly at night.   Cognitive impairment --She notes memory impairment and difficulty concentrating due to fatigue.   Long sleeper --Sleep 10 to 12 hours per day.       Date: 05/12/2015  MRN# [id number] [name] [name] 07-02-1961  Referring Physician: Dr. Isidoro Donning is a 54 y.o. old female seen in consultation for possible sleep apnea.   CC:  Chief Complaint  Patient presents with  . Advice Only    Referred by pcp Amy Bedsole for sleep apnea;    HPI:   She went for a routine eye exam because she was noticing her vision was becoming blurry without her glasses. 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    She presents for a sleep evaluation. She complains of excessive daytime sleepiness.  Symptoms began 2 years ago, unchanged since that time. She goes to sleep at 7 weekdays and 7 weekends. She awakens 530 weekdays and 530 weekends. She falls asleep in 10 minutes.  Collar size 16. She denies knees buckling with laughing. She sleeps for 10 to 12 hours per day.   She notes that her memory has become worse. She feels that people are irritated at work because she can not concentrate and often has to ask questions repeatedly.  She has to force herself to stay awake occasionally while driving.  She snores loudly.   Her only regular medication currently is vitamin D; She used to antivert in the past for vertigo a year ago. She  was on meclizine but has not taken it for about a year. She takes no other OTC meds.   She smoked; 1 pack would last 1 week, she stopped 3 years ago.   PMHX:   History reviewed. No pertinent past medical history. Surgical Hx:  Past Surgical History  Procedure Laterality Date  . Ganglion cyst excision  11/2005  . Colonoscopy     Family Hx:  Family History  Problem Relation Age of Onset  . Cancer Mother     lung cancer age 52, colon cancer age 41  . Hypertension Mother   . Colon cancer Mother   . Heart disease Father   . COPD Father   . Stroke Sister   . Heart disease Sister   . Stroke Sister   . Heart disease Sister   . Colon cancer Maternal Aunt   . Esophageal cancer Neg Hx   . Stomach cancer Neg Hx   . Rectal cancer Neg Hx    Social Hx:   History  Substance Use Topics  . Smoking status: Former Smoker    Types: Cigarettes  . Smokeless tobacco: Never Used     Comment: only smokes a few days a week  . Alcohol Use: Yes     Comment: very, very rarely   Medication:   Current Outpatient Rx  Name  Route  Sig  Dispense  Refill  . azithromycin (ZITHROMAX) 250 MG tablet      2  tab po x 1 day then 1 tab po daily   6 tablet   0   . fluocinonide (LIDEX) 0.05 % external solution   Topical   Apply 1 application topically 2 (two) times daily.         . meclizine (ANTIVERT) 50 MG tablet   Oral   Take 0.5 tablets (25 mg total) by mouth 3 (three) times daily as needed.   30 tablet   0   . Vitamin D, Ergocalciferol, (DRISDOL) 50000 UNITS CAPS capsule      TAKE 1 CAPSULE (50,000 UNITS TOTAL) BY MOUTH EVERY 7 (SEVEN) DAYS.   12 capsule   0       Allergies:  Doxycycline and Sulfa antibiotics  Review of Systems: Gen:  Denies  fever, sweats, chills HEENT: Denies blurred vision, double vision. Cvc:  No dizziness, chest pain or heaviness Resp:   Denies cough or sputum porduction, shortness of breath Gi: Denies swallowing difficulty, stomach pain  Gu:  Denies  bladder incontinence, burning urine Ext:   No Joint pain, stiffness or swelling Skin: No skin rash, easy bruising or bleeding or hives Endoc:  No polyuria, polydipsia , polyphagia or weight change Psych: No depression, insomnia or hallucinations  Other:  All other systems negative  Physical Examination:   VS: BP 102/60 mmHg  Pulse 68  Temp(Src) 97.7 F (36.5 C) (Oral)  Ht 5\' 6"  (1.676 m)  Wt 150 lb (68.04 kg)  BMI 24.22 kg/m2  SpO2 100%  General Appearance: No distress  Neuro:without focal findings,  speech normal,  HEENT: PERRLA, EOM intact.  Mallampati 3; pendulous uvula.  Pulmonary: normal breath sounds, No wheezing, No rales;    CardiovascularNormal S1,S2.  No m/r/g.   Abdomen: Benign, Soft, non-tender. Renal:  No costovertebral tenderness  GU:  No performed at this time. Endoc: No evident thyromegaly, no signs of acromegaly or Cushing features Skin:   warm, no rashes, no ecchymosis  Extremities: normal, no cyanosis, clubbing, no edema, warm with normal capillary refill. Other findings:      Orders for this visit: No orders of the defined types were placed in this encounter.     Thank  you for the consultation and for allowing [facility] to assist in the care of your patient. Our recommendations are noted above.  Please contact us if we can be of further service.   Marda Stalker, MD.  Board Certified in Internal Medicine, Pulmonary Medicine, [place], and Sleep Medicine.  [facility] Office Number: [phone]  Patricia Pesa, M.D.  Vilinda Boehringer, M.D.  Cheral Marker, M.D

## 2015-05-12 NOTE — Patient Instructions (Signed)
--  Needs to undergo sleep study.  --If the initial sleep study is positive, may need a follow-up sleep study to determine pressure from the sleep device.

## 2015-05-12 NOTE — Assessment & Plan Note (Signed)
--  Snores loudly at night.

## 2015-05-12 NOTE — Assessment & Plan Note (Signed)
-  Sleep apnea can worsen diabetes.

## 2015-05-12 NOTE — Assessment & Plan Note (Addendum)
-  Excessive sleepiness during the day, memory impairment despite adequate sleep time at night without any significant medication usage, which could be making her sleepy. Appears to be suspicious for obstructive sleep apnea. Will send for sleep study.

## 2015-05-12 NOTE — Assessment & Plan Note (Signed)
--  Sleep 10 to 12 hours per day.

## 2015-05-12 NOTE — Assessment & Plan Note (Signed)
--  She notes memory impairment and difficulty concentrating due to fatigue.

## 2015-05-12 NOTE — Assessment & Plan Note (Signed)
-  This may be exacerbated by the presence of sleep apnea if present

## 2015-05-13 ENCOUNTER — Encounter: Payer: Managed Care, Other (non HMO) | Admitting: Family Medicine

## 2015-05-17 ENCOUNTER — Telehealth: Payer: Self-pay | Admitting: *Deleted

## 2015-05-17 ENCOUNTER — Ambulatory Visit (INDEPENDENT_AMBULATORY_CARE_PROVIDER_SITE_OTHER): Payer: Managed Care, Other (non HMO) | Admitting: Family Medicine

## 2015-05-17 ENCOUNTER — Encounter: Payer: Self-pay | Admitting: Family Medicine

## 2015-05-17 VITALS — BP 122/82 | HR 60 | Temp 97.6°F | Ht 65.0 in | Wt 149.2 lb

## 2015-05-17 DIAGNOSIS — E785 Hyperlipidemia, unspecified: Secondary | ICD-10-CM

## 2015-05-17 DIAGNOSIS — Z0000 Encounter for general adult medical examination without abnormal findings: Secondary | ICD-10-CM | POA: Diagnosis not present

## 2015-05-17 NOTE — Progress Notes (Signed)
Subjective:    Patient ID: [name] [name], female    DOB: April 23, 1961, 54 y.o.   MRN: [id number]  HPI The patient is here for annual wellness exam and preventative care.   Dx by eye MD with  optic neuropathy... Recommended sleep study. Referred to pulm. Saw Dr. Alfonso Patten  last week. Planned sleep study. Snores a lot.  She reports continued fatigue. Dozes off a lot.  BP Readings from Last 3 Encounters:  05/17/15 122/82  05/12/15 102/60  10/07/14 110/62   Elevated Cholesterol:  LDL almost at goal but trending higher. Lab Results  Component Value Date   CHOL 217* 05/09/2015   HDL 62.60 05/09/2015   LDLCALC 136* 05/09/2015   LDLDIRECT 126.6 12/22/2012   TRIG 93.0 05/09/2015   CHOLHDL 3 05/09/2015  Using medications without problems: No med. Muscle aches:  none Diet compliance: moderate Exercise: walking but not consistently Other complaints:  Prediabetes , resolved. Lab Results  Component Value Date   HGBA1C 5.4 05/09/2015     Review of Systems  Constitutional: Positive for fatigue. Negative for fever.  HENT: Negative for ear pain.   Eyes: Negative for pain.  Respiratory: Negative for chest tightness and shortness of breath.   Cardiovascular: Negative for chest pain, palpitations and leg swelling.  Gastrointestinal: Negative for abdominal pain.  Genitourinary: Negative for dysuria.       Objective:   Physical Exam  Constitutional: Vital signs are normal. She appears well-developed and well-nourished. She is cooperative.  Non-toxic appearance. She does not appear ill. No distress.  HENT:  Head: Normocephalic.  Right Ear: Hearing, tympanic membrane, external ear and ear canal normal. Tympanic membrane is not erythematous, not retracted and not bulging.  Left Ear: Hearing, tympanic membrane, external ear and ear canal normal. Tympanic membrane is not erythematous, not retracted and not bulging.  Nose: No mucosal edema or rhinorrhea. Right sinus exhibits no maxillary  sinus tenderness and no frontal sinus tenderness. Left sinus exhibits no maxillary sinus tenderness and no frontal sinus tenderness.  Mouth/Throat: Uvula is midline, oropharynx is clear and moist and mucous membranes are normal.  Eyes: Conjunctivae, EOM and lids are normal. Pupils are equal, round, and reactive to light. Lids are everted and swept, no foreign bodies found.  Neck: Trachea normal and normal range of motion. Neck supple. Carotid bruit is not present. No thyroid mass and no thyromegaly present.  Cardiovascular: Normal rate, regular rhythm, S1 normal, S2 normal, normal heart sounds, intact distal pulses and normal pulses.  Exam reveals no gallop and no friction rub.   No murmur heard. Pulmonary/Chest: Effort normal and breath sounds normal. No tachypnea. No respiratory distress. She has no decreased breath sounds. She has no wheezes. She has no rhonchi. She has no rales.  Abdominal: Soft. Normal appearance and bowel sounds are normal. There is no tenderness.  Neurological: She is alert.  Skin: Skin is warm, dry and intact. No rash noted.  Psychiatric: Her speech is normal and behavior is normal. Judgment and thought content normal. Her mood appears not anxious. Cognition and memory are normal. She does not exhibit a depressed mood.          Assessment & Plan:  The patient's preventative maintenance and recommended screening tests for an annual wellness exam were reviewed in full today. Brought up to date unless services declined.  Counselled on the importance of diet, exercise, and its role in overall health and mortality. The patient's FH and SH was reviewed, including their home life,  tobacco status, and drug and alcohol status.   GYN: Dr. Ulanda Edison Mammogram:  nml at at GYN office. Vaccine: Uptodate with Tdap. Colon: 11/2012, Dr. Fuller Plan polyps, repeat in 5 years.

## 2015-05-17 NOTE — Assessment & Plan Note (Signed)
LDL almost at goal. Encouraged exercise, weight loss, healthy eating habits.  Info given on low chol diet.

## 2015-05-17 NOTE — Telephone Encounter (Signed)
Mammogram F/U Call: Pt advise me she gets mammograms done at GYN's office 02/2015  Called GYN's office and she did have a recent mammogram and they are faxing over the report

## 2015-05-17 NOTE — Patient Instructions (Signed)
Work on [employer]  Diet and regular exercise.  Call if issues setting up sleep test.

## 2015-05-17 NOTE — Progress Notes (Signed)
Pre visit review using our clinic review tool, if applicable. No additional management support is needed unless otherwise documented below in the visit note. 

## 2015-05-19 ENCOUNTER — Telehealth: Payer: Self-pay | Admitting: Family Medicine

## 2015-05-20 ENCOUNTER — Telehealth: Payer: Self-pay | Admitting: Internal Medicine

## 2015-05-20 NOTE — Telephone Encounter (Signed)
Spoke to [email or web address] she has a form that [place] requires to be filled out and faxed to them for this pt to do a sleep study i had her fax it to me,i then called this pt to let her know the process her incurance requires for the sleep study and that [name] is faxing me the form, i will fill out the form and have [name] to sign and then fax back to her,pt is aware this is a lengthy process and as soon as we hear back from her [employer]  we will get her scheduled she is also aware [name] may require she have a home sleep study she does not want to do this but will do whatever [place] says [name]

## 2015-06-07 ENCOUNTER — Telehealth: Payer: Self-pay | Admitting: Internal Medicine

## 2015-06-07 DIAGNOSIS — R0683 Snoring: Secondary | ICD-10-CM

## 2015-06-07 NOTE — Telephone Encounter (Signed)
Received notification from [facility] on 06/06/15 that in lab study was denied. [name] [name] will approve HST-Authorization # [id number] valid from 06/02/15 to 08/30/15. Called patient to arrange HST and explained to patient that we only received this information on 06/06/15 from [facility] and didn't know until yesterday that in lab study was denied. Pt has arranged to p/u HST device in [place] on Thurs. 06/16/15 at 9:00 am.   Please place order for HST. Please advise. Thanks, [name]

## 2015-06-08 NOTE — Telephone Encounter (Signed)
HST ordered. [name], please advise. Thanks.

## 2015-06-08 NOTE — Telephone Encounter (Signed)
Order received and patient has been scheduled to p/u the HST device on Thurs 06/16/15 at [facility]. Nothing else needed at this time. [name] J Cobb

## 2015-06-10 NOTE — Telephone Encounter (Signed)
disregard

## 2015-06-16 DIAGNOSIS — G4733 Obstructive sleep apnea (adult) (pediatric): Secondary | ICD-10-CM | POA: Diagnosis not present

## 2015-06-23 ENCOUNTER — Other Ambulatory Visit: Payer: Self-pay | Admitting: *Deleted

## 2015-06-23 DIAGNOSIS — R0683 Snoring: Secondary | ICD-10-CM

## 2015-06-23 DIAGNOSIS — G4733 Obstructive sleep apnea (adult) (pediatric): Secondary | ICD-10-CM | POA: Diagnosis not present

## 2015-06-24 ENCOUNTER — Other Ambulatory Visit: Payer: Self-pay | Admitting: *Deleted

## 2015-06-24 ENCOUNTER — Telehealth: Payer: Self-pay | Admitting: Internal Medicine

## 2015-06-24 DIAGNOSIS — G4733 Obstructive sleep apnea (adult) (pediatric): Secondary | ICD-10-CM

## 2015-06-24 NOTE — Addendum Note (Signed)
Addended by: Oscar La R on: 06/24/2015 02:35 PM   Modules accepted: Orders

## 2015-06-24 NOTE — Telephone Encounter (Signed)
Pt informed of results and agrees with recommendation. Order placed for CPAP titration. Will fax results to [facility] per pt request. Nothing further needed.

## 2015-06-24 NOTE — Telephone Encounter (Signed)
Positive sleep study with apnea hypopnea index of 11. The patient is to be set up for a CPAP titration study, will need follow-up 2-3 months after that test is performed and the patient started on CPAP.

## 2015-06-24 NOTE — Telephone Encounter (Signed)
LMOM for pt to return call to get Sleep results. Will await call.

## 2015-07-04 ENCOUNTER — Telehealth: Payer: Self-pay | Admitting: *Deleted

## 2015-07-04 DIAGNOSIS — G4733 Obstructive sleep apnea (adult) (pediatric): Secondary | ICD-10-CM

## 2015-07-04 NOTE — Telephone Encounter (Signed)
Orders placed.

## 2015-07-07 ENCOUNTER — Telehealth: Payer: Self-pay | Admitting: Internal Medicine

## 2015-07-07 NOTE — Telephone Encounter (Signed)
Order was faxed to [facility] on 07/05/15. NOTHING FURTHER NEEDED

## 2015-07-15 ENCOUNTER — Encounter: Payer: Self-pay | Admitting: Internal Medicine

## 2015-08-12 ENCOUNTER — Telehealth: Payer: Self-pay | Admitting: *Deleted

## 2015-08-12 NOTE — Telephone Encounter (Signed)
Spoke with pt to inform her of response to her compliance report per Dr. Ashby Dawes stating to let her know that id she uses it all night she would get the best benefit from it. Pt informed and was upset that provider didn't call her himself and find out why she is not using it all night. States she doesn't sleep all night or like a normal person. States for the last night or 2 she hasn't used the CPAP due to having bronchitis at this time. States also that [name] [name] has called her stating she has a leak. She says she feels air coming out around the tubing that goes into the mask. Suanne Marker will contact [employer] to contact  Pt about trying to fix this issue. Pt wants provider to give her a call.   FYI: Dr. Ashby Dawes, please call pt per her request.

## 2015-08-12 NOTE — Telephone Encounter (Signed)
Called and spoke with [place] at [place] and advised her that patient is having some leaks according to [employer] (pt's insurance).  Pt states that is why she is unable to use this machine all night.  [place] with [facility] stated that she would have the RT, Kim contact patient and have her bring in her machine and mask to check this issue.  Pt advised that if she hasn't heard from [place] by the middle of next week, to contact us so we can f/u with [employer].  [name] J Cobb

## 2015-08-16 ENCOUNTER — Encounter: Payer: Self-pay | Admitting: Family Medicine

## 2015-08-16 ENCOUNTER — Ambulatory Visit (INDEPENDENT_AMBULATORY_CARE_PROVIDER_SITE_OTHER): Payer: Managed Care, Other (non HMO) | Admitting: Family Medicine

## 2015-08-16 VITALS — BP 129/69 | HR 76 | Temp 98.3°F | Ht 65.0 in | Wt 148.8 lb

## 2015-08-16 DIAGNOSIS — J45901 Unspecified asthma with (acute) exacerbation: Secondary | ICD-10-CM | POA: Diagnosis not present

## 2015-08-16 MED ORDER — BENZONATATE 200 MG PO CAPS: 200.0000 mg | ORAL_CAPSULE | Freq: Two times a day (BID) | ORAL | Status: DC | PRN

## 2015-08-16 MED ORDER — BECLOMETHASONE DIPROPIONATE 80 MCG/ACT IN AERS: 1.0000 | INHALATION_SPRAY | Freq: Two times a day (BID) | RESPIRATORY_TRACT | Status: DC

## 2015-08-16 NOTE — Progress Notes (Signed)
   Subjective:    Patient ID: [name] [name], female    DOB: [birth year], 54 y.o.   MRN: [id number]  HPI  54 year old female presents for follow up after Urgent Care visit on 08/13/2015 for cough, hoarse voice Diagnosed with acute bronchospasm. Cough suppressant rx did not help. Treated with  Albuterol and pred taper starting at 30 mg daily. Occurred after being in [place] house on 10/29.  She reports she is getting her voice back slowly.  She continues with cough, hacky, dry.  Post nasal drip.  SOB with coughing fits.  No fever. No sinus pressure, no ear pain, no sore throat.   Social History /Family History/Past Medical History reviewed and updated if needed.  Former smoker. Review of Systems  Constitutional: Negative for fever and fatigue.  HENT: Negative for ear pain.   Eyes: Negative for pain.  Respiratory: Positive for cough, chest tightness and shortness of breath.   Cardiovascular: Negative for chest pain, palpitations and leg swelling.  Gastrointestinal: Negative for abdominal pain.  Genitourinary: Negative for dysuria.       Objective:   Physical Exam  Constitutional: Vital signs are normal. She appears well-developed and well-nourished. She is cooperative.  Non-toxic appearance. She does not appear ill. No distress.  HENT:  Head: Normocephalic.  Right Ear: Hearing, tympanic membrane, external ear and ear canal normal. Tympanic membrane is not erythematous, not retracted and not bulging.  Left Ear: Hearing, tympanic membrane, external ear and ear canal normal. Tympanic membrane is not erythematous, not retracted and not bulging.  Nose: No mucosal edema or rhinorrhea. Right sinus exhibits no maxillary sinus tenderness and no frontal sinus tenderness. Left sinus exhibits no maxillary sinus tenderness and no frontal sinus tenderness.  Mouth/Throat: Uvula is midline, oropharynx is clear and moist and mucous membranes are normal.  Eyes: Conjunctivae, EOM and lids are  normal. Pupils are equal, round, and reactive to light. Lids are everted and swept, no foreign bodies found.  Neck: Trachea normal and normal range of motion. Neck supple. Carotid bruit is not present. No thyroid mass and no thyromegaly present.  Cardiovascular: Normal rate, regular rhythm, S1 normal, S2 normal, normal heart sounds, intact distal pulses and normal pulses.  Exam reveals no gallop and no friction rub.   No murmur heard. Pulmonary/Chest: Effort normal. No tachypnea. No respiratory distress. She has no decreased breath sounds. She has wheezes. She has no rhonchi. She has no rales.  Abdominal: Soft. Normal appearance and bowel sounds are normal. There is no tenderness.  Neurological: She is alert.  Skin: Skin is warm, dry and intact. No rash noted.  Psychiatric: Her speech is normal and behavior is normal. Judgment and thought content normal. Her mood appears not anxious. Cognition and memory are normal. She does not exhibit a depressed mood.          Assessment & Plan:

## 2015-08-16 NOTE — Telephone Encounter (Signed)
Spoke with [place] at [place] and [place] stated that Maudie Mercury (RT) with [employer] contact patient on Friday 08/12/15. Pt told Maudie Mercury that [employer] contacted her about not using the machine and pt didn't mention having any mask leaks.  Pt stated that she wasn't using the machine due to having Bronchitis. [place] with [facility] stated that the RT, Maudie Mercury will double check on the mask leak issue by pulling the download up, but stated that the patient didn't mention leaks as being an issue.  If the download indicates pt is having leaks, Maudie Mercury will contact patient back and arrange for her to bring in the device with her mask. [name] J Cobb  Pt would like to speak with Dr. Ashby Dawes

## 2015-08-16 NOTE — Progress Notes (Signed)
Pre visit review using our clinic review tool, if applicable. No additional management support is needed unless otherwise documented below in the visit note. 

## 2015-08-16 NOTE — Patient Instructions (Addendum)
Finish prednisone taper. Start zyrtec at bedtime. Start Qvar inhaled steroid twice daily. Can use benzonatate for dry cough.

## 2015-08-17 NOTE — Telephone Encounter (Signed)
Discussed with patient her respiratory issues are now much better. She is now using the CPAP more often, and is feeling much better during the day. She is no longer sleepy during the day. I advised her to continue to use the mask whenever she is in bed and maximize its use. She was agreeable to this and said she would continue to use the mask whenever in bed.

## 2015-08-17 NOTE — Telephone Encounter (Signed)
Nothing else needed at this time. Phone note closed. [name] J Cobb ° °

## 2015-08-25 ENCOUNTER — Ambulatory Visit (INDEPENDENT_AMBULATORY_CARE_PROVIDER_SITE_OTHER): Payer: Managed Care, Other (non HMO) | Admitting: Family Medicine

## 2015-08-25 ENCOUNTER — Encounter: Payer: Self-pay | Admitting: Family Medicine

## 2015-08-25 DIAGNOSIS — J029 Acute pharyngitis, unspecified: Secondary | ICD-10-CM | POA: Diagnosis not present

## 2015-08-25 LAB — POCT RAPID STREP A (OFFICE): Rapid Strep A Screen: NEGATIVE

## 2015-08-25 MED ORDER — AMOXICILLIN 500 MG PO CAPS: 1000.0000 mg | ORAL_CAPSULE | Freq: Two times a day (BID) | ORAL | Status: DC

## 2015-08-25 NOTE — Assessment & Plan Note (Signed)
Rapid strep negative. Will send for culture given exudate in throat. Will treat with antibiotics empirically given symptoms and exudate.

## 2015-08-25 NOTE — Progress Notes (Signed)
   Subjective:    Patient ID: DARNESHA BEMIS, female    DOB: 06-Jan-1961, 54 y.o.   MRN: [id number]  HPI  54 year old female presents following recent treatment with steroid for acute bronchospasm/reactive airway episode and allergy. At last OV on 11/8 she was told to start zyrtec and Qvar as well as benzonatate for cough.  Today she reports Qvar helped with hoarseness. Cough fits. Resolved.   Went to dental appt on Monday. Put on crown. Mouth open for 45 min.  Next day woke up with pain in right throat and right side of face  Dentist saw her to see if it was tooth. Does not appear to be.  Started on codeine and  Lidocaine mouth wash.  Now with white spot on right posterior throat. Pain in right ear, fatigued.  She cannot eat or swallow.  Review of Systems  Constitutional: Positive for fatigue. Negative for fever.  HENT: Positive for ear pain.   Eyes: Negative for pain.  Respiratory: Negative for cough and shortness of breath.   Cardiovascular: Negative for chest pain.  Gastrointestinal: Negative for abdominal pain.       Objective:   Physical Exam  Constitutional: Vital signs are normal. She appears well-developed and well-nourished. She is cooperative.  Non-toxic appearance. She does not appear ill. No distress.  HENT:  Head: Normocephalic.  Right Ear: Hearing, tympanic membrane, external ear and ear canal normal. Tympanic membrane is not erythematous, not retracted and not bulging.  Left Ear: Hearing, tympanic membrane, external ear and ear canal normal. Tympanic membrane is not erythematous, not retracted and not bulging.  Nose: No mucosal edema or rhinorrhea. Right sinus exhibits no maxillary sinus tenderness and no frontal sinus tenderness. Left sinus exhibits no maxillary sinus tenderness and no frontal sinus tenderness.  Mouth/Throat: Uvula is midline and mucous membranes are normal. Oropharyngeal exudate and posterior oropharyngeal erythema present. No posterior  oropharyngeal edema or tonsillar abscesses.  Eyes: Conjunctivae, EOM and lids are normal. Pupils are equal, round, and reactive to light. Lids are everted and swept, no foreign bodies found.  Neck: Trachea normal and normal range of motion. Neck supple. Carotid bruit is not present. No thyroid mass and no thyromegaly present.  Cardiovascular: Normal rate, regular rhythm, S1 normal, S2 normal, normal heart sounds, intact distal pulses and normal pulses.  Exam reveals no gallop and no friction rub.   No murmur heard. Pulmonary/Chest: Effort normal and breath sounds normal. No tachypnea. No respiratory distress. She has no decreased breath sounds. She has no wheezes. She has no rhonchi. She has no rales.  Neurological: She is alert.  Skin: Skin is warm, dry and intact. No rash noted.  Psychiatric: Her speech is normal and behavior is normal. Judgment normal. Her mood appears not anxious. Cognition and memory are normal. She does not exhibit a depressed mood.          Assessment & Plan:

## 2015-08-25 NOTE — Patient Instructions (Addendum)
Start course on amoxicillin for acute pharyngitis. Can use tylenol with codeine for pain. Push fluids.  Use mouth wash lidocaine for pain. Call if not improving by early next week.

## 2015-08-25 NOTE — Progress Notes (Signed)
Pre visit review using our clinic review tool, if applicable. No additional management support is needed unless otherwise documented below in the visit note. 

## 2015-08-26 DIAGNOSIS — J45901 Unspecified asthma with (acute) exacerbation: Secondary | ICD-10-CM | POA: Insufficient documentation

## 2015-08-26 NOTE — Assessment & Plan Note (Signed)
Asthma flare like due to allergy trigger, no clear bacterial infection noted.Finish prednisone taper. Start zyrtec at bedtime. Start Qvar inhaled steroid twice daily. Can use benzonatate for dry cough.

## 2015-08-27 LAB — CULTURE, GROUP A STREP: ORGANISM ID, BACTERIA: NORMAL

## 2015-08-29 ENCOUNTER — Encounter: Payer: Self-pay | Admitting: *Deleted

## 2015-08-29 ENCOUNTER — Telehealth: Payer: Self-pay | Admitting: Family Medicine

## 2015-08-29 NOTE — Telephone Encounter (Signed)
Spoke with [name] [name] and notified her that her Strep Culture was also negative.  She states she has done some research and she thinks her symptoms are coming from taking too much prednisone.  She states she read you can have ear and throat pain as well as the white mucus from taking high doses of prednisone. She states she is about 85% better.  She can swallow now but is still having some pain in the right side back of her throat.  She is inquiring if she should continue taking the antibiotics.  Please advise.

## 2015-08-29 NOTE — Telephone Encounter (Signed)
Pt called and would like call back. She wanted to update you on her condition.   [phone]

## 2015-08-30 NOTE — Telephone Encounter (Signed)
Left message for Ms. Hsu-okay to stop antibiotics per Dr. Diona Browner.

## 2015-08-30 NOTE — Telephone Encounter (Signed)
Have pt stop antibiotics.

## 2015-09-05 ENCOUNTER — Encounter: Payer: Self-pay | Admitting: Internal Medicine

## 2015-09-14 ENCOUNTER — Ambulatory Visit (HOSPITAL_BASED_OUTPATIENT_CLINIC_OR_DEPARTMENT_OTHER): Payer: Managed Care, Other (non HMO)

## 2015-10-05 ENCOUNTER — Encounter: Payer: Self-pay | Admitting: Internal Medicine

## 2015-10-14 ENCOUNTER — Telehealth: Payer: Self-pay | Admitting: *Deleted

## 2015-10-14 MED ORDER — SCOPOLAMINE 1 MG/3DAYS TD PT72: 1.0000 | MEDICATED_PATCH | TRANSDERMAL | Status: DC

## 2015-10-14 NOTE — Telephone Encounter (Signed)
Patient left voice mail requesting a prescription for Scop patches.  She is going on a 7 day cruise and would like 6 patches.  Please advise.

## 2015-10-14 NOTE — Telephone Encounter (Signed)
Rx sent 

## 2015-10-17 ENCOUNTER — Telehealth: Payer: Self-pay | Admitting: *Deleted

## 2015-10-17 ENCOUNTER — Telehealth: Payer: Self-pay

## 2015-10-17 NOTE — Telephone Encounter (Signed)
Left message for Krislin that prescription for patch for her cruise has been sent into her pharmacy as requested.

## 2015-10-17 NOTE — Telephone Encounter (Signed)
[name] [name] with [facility] left v/m; transderm scope patches no longer available and has been discontinued. Please send in substitute med. [facility].

## 2015-10-17 NOTE — Telephone Encounter (Signed)
Pt left v/m; wants substitute med sent to [facility] since no longer make the transderm scope patch. Pt request cb.

## 2015-10-17 NOTE — Telephone Encounter (Signed)
PLEASE NOTE: All timestamps contained within this report are represented as [place] Standard Time. CONFIDENTIALTY NOTICE: This fax transmission is intended only for the addressee. It contains information that is legally privileged, confidential or otherwise protected from use or disclosure. If you are not the intended recipient, you are strictly prohibited from reviewing, disclosing, copying using or disseminating any of this information or taking any action in reliance on or regarding this information. If you have received this fax in error, please notify us immediately by telephone so that we can arrange for its return to [place]. Phone: [phone], Toll-Free: [phone], Fax: [phone] Page: 1 of 1 Call Id: [id number] [place] Patient Name: [name] [name] Gender: Female DOB: 1961/02/06 Age: 55 Y 11 M 23 D Return Phone Number: [phone] (Primary) Address: City/State/Zip: [place] Night - Client Client Site [place] [facility] Diona Browner, [place] Contact Type Call Call Type Triage / Clinical Relationship To Patient Self Return Phone Number [phone] (Primary) Chief Complaint Medication Question (non symptomatic) Initial Comment Caller states her DR called in the wrong nausea patch to her pharmacy. Nurse Assessment Nurse: Einar Gip, RN, Neoma Laming Date/Time Eilene Ghazi Time): 10/14/2015 6:28:20 PM Confirm and document reason for call. If symptomatic, describe symptoms. ---Caller states she needs a different medication for nausea because she is going on a cruise. Advised no meds when the office is closed. Advised if she needs medication prior to Monday she would need to go somewhere to be seen. Caller disconnected the call. Has the patient traveled out of the country within the last 30 days? ---Not Applicable Does the patient have  any new or worsening symptoms? ---No Guidelines Guideline Title Affirmed Question Affirmed Notes Nurse Date/Time (Eastern Time) Disp. Time Eilene Ghazi Time) Disposition Final User 10/14/2015 6:30:13 PM Clinical Call Yes Einar Gip, RN, Neoma Laming After Care Instructions Given Call Event Type User Date / Time Description

## 2015-10-18 MED ORDER — SCOPOLAMINE 1 MG/3DAYS TD PT72: 1.0000 | MEDICATED_PATCH | TRANSDERMAL | Status: DC

## 2015-10-18 NOTE — Telephone Encounter (Signed)
Noted  

## 2015-10-18 NOTE — Telephone Encounter (Signed)
Spoke with [name] [name].  She does not want to try any of the oral medications.  She wants the patch.  [facility].  She states that her son just went on a cruise and he was able to get the patches.  She ask that I send the prescription to a different pharmacy.  Prescription resent to [facility] on [address] as requested.

## 2015-10-18 NOTE — Telephone Encounter (Signed)
Call pt. Does she still ewant this. If so find out what other options the pharmacy does have available. Thanks.

## 2015-10-18 NOTE — Telephone Encounter (Signed)
[facility]  There is no other patch available for motion sickness.  She recommended Meclizine, Zofran or Phenergan.  Left message for Ms. Ratay with her options and ask that she call me back with what she would like to do.

## 2016-02-01 ENCOUNTER — Telehealth: Payer: Self-pay | Admitting: Internal Medicine

## 2016-02-01 NOTE — Telephone Encounter (Signed)
Patient is calling and she has been on a cpap for 6 months and still feeling very drowsy (woosy). Please call patient.

## 2016-02-02 NOTE — Telephone Encounter (Signed)
Please advise on what your advice would be. Thanks.

## 2016-02-02 NOTE — Telephone Encounter (Signed)
Nothing further needed. appt scheduled per DR recommendations.

## 2016-02-02 NOTE — Telephone Encounter (Signed)
Patient called office to check on md response.  Scheduled for ov visit on 02-07-16 @ 4 with Dr. Juanell Fairly.  Please call patient if further response needed.

## 2016-02-02 NOTE — Telephone Encounter (Signed)
Not sure what that could be from, recommend that she come it so be seen.

## 2016-02-05 ENCOUNTER — Encounter: Payer: Self-pay | Admitting: Internal Medicine

## 2016-02-07 ENCOUNTER — Ambulatory Visit (INDEPENDENT_AMBULATORY_CARE_PROVIDER_SITE_OTHER): Payer: Managed Care, Other (non HMO) | Admitting: Internal Medicine

## 2016-02-07 ENCOUNTER — Encounter: Payer: Self-pay | Admitting: Internal Medicine

## 2016-02-07 VITALS — BP 106/72 | HR 69 | Ht 65.5 in | Wt 150.0 lb

## 2016-02-07 DIAGNOSIS — G4733 Obstructive sleep apnea (adult) (pediatric): Secondary | ICD-10-CM | POA: Diagnosis not present

## 2016-02-07 NOTE — Patient Instructions (Addendum)
--  Use your CPAP every night, all night.   --Will send for MSLT. Follow up after testing.

## 2016-02-07 NOTE — Progress Notes (Signed)
* [facility]     Assessment and Plan:  Obstructive sleep apnea. -HST 06/16/15: AHI 11. --Advised to use CPAP more regularly, all night, every night.   Idiopathic hypersomnolence/narcolepsy.  --Will send for MSLT.   Excessive daytime sleepiness --Continued excessive sleepiness, may be idiopathic vs narcolepsy. Will send for MSLT; may need to start on modafinil.      Date: 02/07/2016  MRN# [id number] [name] [name] 1961-04-12   [name] [name] is a 55 y.o. old female seen in follow up for chief complaint of  Chief Complaint  Patient presents with  . Follow-up    pt states she wears CPAP 6-7hr nightly. not sleeping well. feels mask is leaking. new mask needed. [id number].      HPI:   The patient is a 55 year old female last seen in the office in August 2016. At that time it was noted that she had symptoms and signs of obstructive sleep apnea and long sleeping time. She underwent an in home sleep test on 06/16/2015, results of this showed an AHI of 11, consistent with mild obstructive sleep apnea. The patient then underwent a titration study.  She has been on CPAP, but still feels sleepy all the time, she was sleeping when I walked into the room today. She goes to bed at 7 pm, wakes at 2 am, then reads, back to sleep at 3, oob at 430. Then she will be drowsy all day, this has been going on for 2 years. No sleep attacks/cataplexy.  She notes that she frequently fall asleep if she is sitting still. She has no neurological symptoms of weakness or paresthesias.   Home sleep study 06/16/2015: Positive for mild affective sleep apnea with AHI of 11. CPAP titration study 06/24/2015: Review of compliance report: avg usage is 4 hours;95th percentile pressure is 8; AHI is 2.4.   Medication:   Outpatient Encounter Prescriptions as of 02/07/2016  Medication Sig  . amoxicillin (AMOXIL) 500 MG capsule Take 2 capsules (1,000 mg total) by mouth 2 (two) times daily.  .  beclomethasone (QVAR) 80 MCG/ACT inhaler Inhale 1 puff into the lungs 2 (two) times daily. (Patient not taking: Reported on 08/25/2015)  . PROAIR HFA 108 (90 BASE) MCG/ACT inhaler INHALE 1-2 PUFFS EVERY 4 TO 6 HOURS AS NEEDED FOR COUGH OR SHORTNESS OF BREATH  . scopolamine (TRANSDERM-SCOP) 1 MG/3DAYS Place 1 patch (1.5 mg total) onto the skin every 3 (three) days.   No facility-administered encounter medications on file as of 02/07/2016.     Allergies:  Doxycycline and Sulfa antibiotics  Review of Systems: Gen:  Denies  fever, sweats. HEENT: Denies blurred vision. Cvc:  No dizziness, chest pain or heaviness Resp:   Denies cough or sputum porduction. Gi: Denies swallowing difficulty, stomach pain. constipation, bowel incontinence Gu:  Denies bladder incontinence, burning urine Ext:   No Joint pain, stiffness. Skin: No skin rash, easy bruising. Endoc:  No polyuria, polydipsia. Psych: No depression, insomnia. Other:  All other systems were reviewed and found to be negative other than what is mentioned in the HPI.   Physical Examination:   VS: BP 106/72 mmHg  Pulse 69  Ht 5' 5.5" (1.664 m)  Wt 150 lb (68.04 kg)  BMI 24.57 kg/m2  SpO2 94%  General Appearance: No distress  Neuro:without focal findings,  speech normal,  HEENT: PERRLA, EOM intact. Pulmonary: normal breath sounds, No wheezing.   CardiovascularNormal S1,S2.  No m/r/g.   Abdomen: Benign, Soft, non-tender. Renal:  No costovertebral  tenderness  GU:  Not performed at this time. Endoc: No evident thyromegaly, no signs of acromegaly. Skin:   warm, no rash. Extremities: normal, no cyanosis, clubbing.   LABORATORY PANEL:   CBC No results for input(s): WBC, HGB, HCT, PLT in the last 168 hours. ------------------------------------------------------------------------------------------------------------------  Chemistries  No results for input(s): NA, K, CL, CO2, GLUCOSE, BUN, CREATININE, CALCIUM, MG, AST, ALT, ALKPHOS,  BILITOT in the last 168 hours.  Invalid input(s): GFRCGP ------------------------------------------------------------------------------------------------------------------  Cardiac Enzymes No results for input(s): TROPONINI in the last 168 hours. ------------------------------------------------------------  RADIOLOGY:   No results found for this or any previous visit. Results for orders placed in visit on 05/11/01  DG Chest 2 View   Narrative FINDINGS CLINICAL DATA:  COUGHING. CHEST TWO VIEW 05/11/01 FINDINGS:  NO PRIOR STUDY FOR COMPARISON. THE HEART SIZE AND MEDIASTINAL CONTOURS ARE NORMAL. THE LUNGS ARE CLEAR. THE VISUALIZED SKELETON IS UNREMARKABLE. IMPRESSION NO ACTIVE DISEASE.   ------------------------------------------------------------------------------------------------------------------  Thank  you for allowing [facility] to assist in the care of your patient. Our recommendations are noted above.  Please contact us if we can be of further service.   Marda Stalker, MD.  [facility] Office Number: [phone]  Patricia Pesa, M.D.  Vilinda Boehringer, M.D.  Merton Border, M.D  02/07/2016

## 2016-02-07 NOTE — Addendum Note (Signed)
Addended by: Maryanna Shape A on: 02/07/2016 04:46 PM   Modules accepted: Orders

## 2016-02-28 ENCOUNTER — Ambulatory Visit (INDEPENDENT_AMBULATORY_CARE_PROVIDER_SITE_OTHER): Payer: Managed Care, Other (non HMO) | Admitting: Family Medicine

## 2016-02-28 ENCOUNTER — Encounter: Payer: Self-pay | Admitting: Family Medicine

## 2016-02-28 VITALS — BP 100/64 | HR 64 | Temp 97.8°F | Ht 65.5 in | Wt 152.5 lb

## 2016-02-28 DIAGNOSIS — G4710 Hypersomnia, unspecified: Secondary | ICD-10-CM | POA: Diagnosis not present

## 2016-02-28 DIAGNOSIS — G4719 Other hypersomnia: Secondary | ICD-10-CM

## 2016-02-28 DIAGNOSIS — F4329 Adjustment disorder with other symptoms: Secondary | ICD-10-CM | POA: Insufficient documentation

## 2016-02-28 DIAGNOSIS — E559 Vitamin D deficiency, unspecified: Secondary | ICD-10-CM | POA: Diagnosis not present

## 2016-02-28 DIAGNOSIS — G4730 Sleep apnea, unspecified: Secondary | ICD-10-CM

## 2016-02-28 HISTORY — DX: Vitamin D deficiency, unspecified: E55.9

## 2016-02-28 NOTE — Progress Notes (Signed)
Pre visit review using our clinic review tool, if applicable. No additional management support is needed unless otherwise documented below in the visit note. 

## 2016-02-28 NOTE — Assessment & Plan Note (Signed)
Chronic and recurrent. Stay on longterm vit D replacement high dose weekly.

## 2016-02-28 NOTE — Assessment & Plan Note (Signed)
No clear diagnosis of  Generalized anxiety or major depressison. GAD 7 12, PHQ9 12. Recommend counseling to work through increased stress with success at work and increase responsibilities.

## 2016-02-28 NOTE — Assessment & Plan Note (Signed)
Followed by pulm. Likely contributing to symptoms.

## 2016-02-28 NOTE — Assessment & Plan Note (Signed)
Possibly due to low vit d as well as increase in stress. Doubt secondary to estrogen issues and HRT not indicated especially givne increase risk of CAD with HRT.  Recommended pt stop HRT.

## 2016-02-28 NOTE — Patient Instructions (Addendum)
Stay on vit D longterm.  Stop at front desk on way out for counseling. Stop hormone replacement as soon as comfortable.  Increase protein in diet, Increase fiber and water.

## 2016-02-28 NOTE — Progress Notes (Signed)
Subjective:    Patient ID: [name] [name], female    DOB: 01-Nov-1960, 55 y.o.   MRN: [id number]  HPI  55 year old female presents for continued fatigue, woozy feeling sleepy x 2 years. She did feel much better earlier in the year, felt much better.  Dx with sleep apnea.. using CPAP.Marland Kitchen Initial improvement in fatigue but has returned. Pulm rec in home study for eval of use.  Has had lab eval in past.. Negative.  She goes to gym 5 days a week for 1 hour. Feels very groggy ( not really tired) during the day.  She has a feeling during the day like she is on codeine, but is not.  Does not eat great.Marland Kitchen Healthy but not three meals a day, low on protein.  2 weeks ago she has been very anxious, tearful, moody.  She does feel high strung, she feel hyped up, not calm had trouble relaxing at beach. Finally was able to with exercise and rest. Saw Dr. Ulanda Edison.Marland Kitchen He felt she is depressed. Now she feels better.    Started on estrogen patch for hormone replacement by Dr. Ulanda Edison. She states he only gave her 2 months given he does not feel it is a hormonal issue. She has now been on HRT x 5 days. She has felt some better. Vit D was also low at 17.. Started 50, 000  Constipation, 1 BM  A week in last few weeks.   Review of Systems  Constitutional: Negative for fever and fatigue.  HENT: Negative for ear pain.   Eyes: Negative for pain.  Respiratory: Negative for chest tightness and shortness of breath.   Cardiovascular: Negative for chest pain, palpitations and leg swelling.  Gastrointestinal: Negative for abdominal pain.  Genitourinary: Negative for dysuria.       Objective:   Physical Exam  Constitutional: Vital signs are normal. She appears well-developed and well-nourished. She is cooperative.  Non-toxic appearance. She does not appear ill. No distress.  HENT:  Head: Normocephalic.  Right Ear: Hearing, tympanic membrane, external ear and ear canal normal. Tympanic membrane is not erythematous,  not retracted and not bulging.  Left Ear: Hearing, tympanic membrane, external ear and ear canal normal. Tympanic membrane is not erythematous, not retracted and not bulging.  Nose: No mucosal edema or rhinorrhea. Right sinus exhibits no maxillary sinus tenderness and no frontal sinus tenderness. Left sinus exhibits no maxillary sinus tenderness and no frontal sinus tenderness.  Mouth/Throat: Uvula is midline, oropharynx is clear and moist and mucous membranes are normal.  Eyes: Conjunctivae, EOM and lids are normal. Pupils are equal, round, and reactive to light. Lids are everted and swept, no foreign bodies found.  Neck: Trachea normal and normal range of motion. Neck supple. Carotid bruit is not present. No thyroid mass and no thyromegaly present.  Cardiovascular: Normal rate, regular rhythm, S1 normal, S2 normal, normal heart sounds, intact distal pulses and normal pulses.  Exam reveals no gallop and no friction rub.   No murmur heard. Pulmonary/Chest: Effort normal and breath sounds normal. No tachypnea. No respiratory distress. She has no decreased breath sounds. She has no wheezes. She has no rhonchi. She has no rales.  Abdominal: Soft. Normal appearance and bowel sounds are normal. There is no tenderness.  Neurological: She is alert.  Skin: Skin is warm, dry and intact. No rash noted.  Psychiatric: Her speech is normal. Judgment and thought content normal. Her mood appears not anxious. She is agitated. Cognition and memory are  normal. She does not exhibit a depressed mood.          Assessment & Plan:

## 2016-04-12 ENCOUNTER — Telehealth: Payer: Self-pay

## 2016-04-12 NOTE — Telephone Encounter (Signed)
Pt left v/m; pt having problems sleeping and anxiety and request med to [facility]; pt was seen 02/28/16; pt is seeing a counselor weekly and has appt with sleep doctor on 04/25/16 and 04/26/16. Pt request cb.

## 2016-04-13 MED ORDER — SERTRALINE HCL 50 MG PO TABS
50.0000 mg | ORAL_TABLET | Freq: Every day | ORAL | Status: DC
Start: 2016-04-13 — End: 2016-06-29

## 2016-04-13 MED ORDER — ALPRAZOLAM 0.25 MG PO TABS: 0.2500 mg | ORAL_TABLET | Freq: Every day | ORAL | Status: DC | PRN

## 2016-04-13 NOTE — Telephone Encounter (Signed)
Ms. Trachtman notified as instructed by telephone.  Alprazolam called into [facility].  Follow up appointment scheduled for 05/11/2016 at 8:15 am with Dr. Diona Browner.

## 2016-04-13 NOTE — Telephone Encounter (Signed)
Start sertraline 50 mg at bedtime  For anxiety. Take daily.. Can take 3-4 week before effective.  I Call in a short term course of alprazolam to use as needed for anxiety until then if severe. She should limit use, but can use until sertraline becomes effective.  Schedule follow up in 4 weeks 30 min OV mood.

## 2016-04-14 ENCOUNTER — Other Ambulatory Visit: Payer: Self-pay | Admitting: Family Medicine

## 2016-04-14 NOTE — Telephone Encounter (Signed)
Last office visit 02/28/2016.  Last Vit D level checked 02/09/2014.  Refill?

## 2016-04-25 ENCOUNTER — Ambulatory Visit: Payer: Managed Care, Other (non HMO) | Attending: Pulmonary Disease

## 2016-04-25 DIAGNOSIS — G4733 Obstructive sleep apnea (adult) (pediatric): Secondary | ICD-10-CM | POA: Diagnosis not present

## 2016-04-26 ENCOUNTER — Ambulatory Visit: Payer: Managed Care, Other (non HMO) | Attending: Pulmonary Disease

## 2016-04-26 DIAGNOSIS — G4710 Hypersomnia, unspecified: Secondary | ICD-10-CM | POA: Diagnosis not present

## 2016-04-26 DIAGNOSIS — G4733 Obstructive sleep apnea (adult) (pediatric): Secondary | ICD-10-CM | POA: Diagnosis present

## 2016-05-01 DIAGNOSIS — G4733 Obstructive sleep apnea (adult) (pediatric): Secondary | ICD-10-CM

## 2016-05-02 DIAGNOSIS — G4733 Obstructive sleep apnea (adult) (pediatric): Secondary | ICD-10-CM | POA: Diagnosis not present

## 2016-05-02 LAB — HM PAP SMEAR: HM PAP: NEGATIVE

## 2016-05-02 LAB — HM MAMMOGRAPHY

## 2016-05-11 ENCOUNTER — Ambulatory Visit: Payer: Managed Care, Other (non HMO) | Admitting: Family Medicine

## 2016-05-11 ENCOUNTER — Telehealth: Payer: Self-pay | Admitting: *Deleted

## 2016-05-11 DIAGNOSIS — G4733 Obstructive sleep apnea (adult) (pediatric): Secondary | ICD-10-CM

## 2016-05-11 NOTE — Telephone Encounter (Signed)
-----   Message from Laverle Hobby, MD sent at 05/10/2016  5:27 PM EDT ----- Discussed sleep study results with patient, will change pressure as ordered.  She is actually sleeping much better and is not as sleepy during the day.

## 2016-05-11 NOTE — Telephone Encounter (Signed)
Order placed for CPAP.

## 2016-05-18 ENCOUNTER — Ambulatory Visit: Payer: Managed Care, Other (non HMO) | Admitting: Family Medicine

## 2016-06-01 ENCOUNTER — Telehealth: Payer: Self-pay | Admitting: Family Medicine

## 2016-06-01 ENCOUNTER — Ambulatory Visit: Payer: Managed Care, Other (non HMO) | Admitting: Family Medicine

## 2016-06-01 DIAGNOSIS — Z0289 Encounter for other administrative examinations: Secondary | ICD-10-CM

## 2016-06-01 NOTE — Telephone Encounter (Signed)
error 

## 2016-06-12 ENCOUNTER — Ambulatory Visit: Payer: Managed Care, Other (non HMO) | Admitting: Family Medicine

## 2016-06-29 ENCOUNTER — Ambulatory Visit (INDEPENDENT_AMBULATORY_CARE_PROVIDER_SITE_OTHER): Payer: Managed Care, Other (non HMO) | Admitting: Family Medicine

## 2016-06-29 ENCOUNTER — Encounter: Payer: Self-pay | Admitting: Family Medicine

## 2016-06-29 DIAGNOSIS — F4329 Adjustment disorder with other symptoms: Secondary | ICD-10-CM | POA: Diagnosis not present

## 2016-06-29 NOTE — Progress Notes (Signed)
Pre visit review using our clinic review tool, if applicable. No additional management support is needed unless otherwise documented below in the visit note. 

## 2016-06-29 NOTE — Progress Notes (Signed)
   Subjective:    Patient ID: [name] [name], female    DOB: [birth year], 55 y.o.   MRN: [id number]  HPI  55 year old female presents for follow up mood. She was last seen 02/2016 for adjustment disorder, stress.  Today she reports she is doing much better. He energy ins much better. She has been eating more veggies, no meat.   Saw psychologist: She is going weekly. Has helped a  Lot.   PHQ9: 0  GAD7:0  Wt Readings from Last 3 Encounters:  06/29/16 151 lb (68.5 kg)  02/28/16 152 lb 8 oz (69.2 kg)  02/07/16 150 lb (68 kg)   She did have an episode of pain in foot. Good now.   Review of Systems  Constitutional: Negative for fatigue and fever.  HENT: Negative for ear pain.   Eyes: Negative for pain.  Respiratory: Negative for chest tightness and shortness of breath.   Cardiovascular: Negative for chest pain, palpitations and leg swelling.  Gastrointestinal: Negative for abdominal pain.  Genitourinary: Negative for dysuria.       Objective:   Physical Exam  Constitutional: Vital signs are normal. She appears well-developed and well-nourished. She is cooperative.  Non-toxic appearance. She does not appear ill. No distress.  HENT:  Head: Normocephalic.  Right Ear: Hearing, tympanic membrane, external ear and ear canal normal. Tympanic membrane is not erythematous, not retracted and not bulging.  Left Ear: Hearing, tympanic membrane, external ear and ear canal normal. Tympanic membrane is not erythematous, not retracted and not bulging.  Nose: No mucosal edema or rhinorrhea. Right sinus exhibits no maxillary sinus tenderness and no frontal sinus tenderness. Left sinus exhibits no maxillary sinus tenderness and no frontal sinus tenderness.  Mouth/Throat: Uvula is midline, oropharynx is clear and moist and mucous membranes are normal.  Eyes: Conjunctivae, EOM and lids are normal. Pupils are equal, round, and reactive to light. Lids are everted and swept, no foreign bodies found.    Neck: Trachea normal and normal range of motion. Neck supple. Carotid bruit is not present. No thyroid mass and no thyromegaly present.  Cardiovascular: Normal rate, regular rhythm, S1 normal, S2 normal, normal heart sounds, intact distal pulses and normal pulses.  Exam reveals no gallop and no friction rub.   No murmur heard. Pulmonary/Chest: Effort normal and breath sounds normal. No tachypnea. No respiratory distress. She has no decreased breath sounds. She has no wheezes. She has no rhonchi. She has no rales.  Abdominal: Soft. Normal appearance and bowel sounds are normal. There is no tenderness.  Neurological: She is alert.  Skin: Skin is warm, dry and intact. No rash noted.  Psychiatric: Her speech is normal and behavior is normal. Judgment and thought content normal. Her mood appears not anxious. Cognition and memory are normal. She does not exhibit a depressed mood.          Assessment & Plan:

## 2016-07-09 ENCOUNTER — Ambulatory Visit (INDEPENDENT_AMBULATORY_CARE_PROVIDER_SITE_OTHER): Payer: Managed Care, Other (non HMO) | Admitting: Family Medicine

## 2016-07-09 ENCOUNTER — Encounter: Payer: Self-pay | Admitting: Family Medicine

## 2016-07-09 VITALS — BP 110/70 | HR 84 | Temp 98.2°F | Wt 153.0 lb

## 2016-07-09 DIAGNOSIS — J069 Acute upper respiratory infection, unspecified: Secondary | ICD-10-CM

## 2016-07-09 DIAGNOSIS — B9789 Other viral agents as the cause of diseases classified elsewhere: Secondary | ICD-10-CM | POA: Diagnosis not present

## 2016-07-09 MED ORDER — IBUPROFEN 600 MG PO TABS: 600.0000 mg | ORAL_TABLET | Freq: Two times a day (BID) | ORAL | 0 refills | Status: DC | PRN

## 2016-07-09 NOTE — Progress Notes (Signed)
   BP 110/70   Pulse 84   Temp 98.2 F (36.8 C) (Oral)   Wt 153 lb (69.4 kg)   BMI 25.07 kg/m    CC: sinusitis? Subjective:    Patient ID: [name] [name], female    DOB: 1961-05-12, 55 y.o.   MRN: [id number]  HPI: [name] [name] is a 55 y.o. female presenting on 07/09/2016 for Sinusitis (chills, eyes feel swollen; ST; fatigued; just doesn't feel good)   4d h/o chils, feverish, sore throat, fatigued, dizzy and drowsy. PNDrainage. Eyes puffy.   No fevers, cough, congestion, ear or tooth pain. No headache, body aches.   Got sick after recent trip to [place]. Sister also feeling ill.  H/o asthma, no recent exacerbation. Prior asthma attacks have sent her to the ER. No current asthma symptoms.  Treating with nyquil.  No sick contacts at home.  No smokers at home.  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   ALLERGY - augmentin, doxycycline, sulfa drugs  Relevant past medical, surgical, family and social history reviewed and updated as indicated. Interim medical history since our last visit reviewed. Allergies and medications reviewed and updated. No current outpatient prescriptions on file prior to visit.   No current facility-administered medications on file prior to visit.     Review of Systems Per HPI unless specifically indicated in ROS section     Objective:    BP 110/70   Pulse 84   Temp 98.2 F (36.8 C) (Oral)   Wt 153 lb (69.4 kg)   BMI 25.07 kg/m   Wt Readings from Last 3 Encounters:  07/09/16 153 lb (69.4 kg)  06/29/16 151 lb (68.5 kg)  02/28/16 152 lb 8 oz (69.2 kg)    Physical Exam  Constitutional: She appears well-developed and well-nourished. No distress.  HENT:  Head: Normocephalic and atraumatic.  Right Ear: Hearing, tympanic membrane, external ear and ear canal normal.  Left Ear: Hearing, tympanic membrane, external ear and ear canal normal.  Nose: No mucosal edema or rhinorrhea. Right sinus exhibits no maxillary sinus  tenderness and no frontal sinus tenderness. Left sinus exhibits no maxillary sinus tenderness and no frontal sinus tenderness.  Mouth/Throat: Uvula is midline, oropharynx is clear and moist and mucous membranes are normal. No oropharyngeal exudate, posterior oropharyngeal edema, posterior oropharyngeal erythema or tonsillar abscesses.  Eyes: Conjunctivae and EOM are normal. Pupils are equal, round, and reactive to light. No scleral icterus.  Neck: Normal range of motion. Neck supple.  Cardiovascular: Normal rate, regular rhythm, normal heart sounds and intact distal pulses.   No murmur heard. Pulmonary/Chest: Effort normal and breath sounds normal. No respiratory distress. She has no wheezes. She has no rales.  Lymphadenopathy:    She has no cervical adenopathy.  Skin: Skin is warm and dry. No rash noted.  Nursing note and vitals reviewed.     Assessment & Plan:   Problem List Items Addressed This Visit    Viral URI - Primary    Lungs clear. No signs of asthma exacerbation. Anticipate viral URI vs viral sinusitis given short duration. Overall well appearing. Supportive care as per instructions. Ibuprofen PRN sinus inflammation Red flags to seek further care discussed. Pt agrees with plan.       Other Visit Diagnoses   None.      Follow up plan: Return if symptoms worsen or fail to improve.  Ria Bush, MD

## 2016-07-09 NOTE — Assessment & Plan Note (Signed)
Lungs clear. No signs of asthma exacerbation. Anticipate viral URI vs viral sinusitis given short duration. Overall well appearing. Supportive care as per instructions. Ibuprofen PRN sinus inflammation Red flags to seek further care discussed. Pt agrees with plan.

## 2016-07-09 NOTE — Progress Notes (Signed)
Pre visit review using our clinic review tool, if applicable. No additional management support is needed unless otherwise documented below in the visit note. 

## 2016-07-09 NOTE — Patient Instructions (Signed)
You have a viral upper respiratory infection, possible viral sinus infection. Take medicine as prescribed: ibuprofen 600mg  twice daily with meals, plain mucinex with plenty of water.  Push fluids and plenty of rest. Nasal saline irrigation or neti pot to help drain sinuses. If nasal congestion starts, may start flonase nasal steroid (sent to pharmacy). Please let us know if fever >101.5, worsening productive cough, or symptoms ongoing past 7-10 days.

## 2016-07-16 ENCOUNTER — Other Ambulatory Visit: Payer: Self-pay | Admitting: Family Medicine

## 2016-07-27 NOTE — Assessment & Plan Note (Signed)
Resolved

## 2016-08-02 ENCOUNTER — Encounter: Payer: Self-pay | Admitting: Internal Medicine

## 2016-08-11 ENCOUNTER — Other Ambulatory Visit: Payer: Self-pay | Admitting: Family Medicine

## 2016-09-06 ENCOUNTER — Other Ambulatory Visit: Payer: Self-pay | Admitting: Family Medicine

## 2016-09-06 NOTE — Telephone Encounter (Signed)
Last office visit 06/29/2016.  Last refilled 08/13/2016 for #30 with no refills.  Ok to refill?

## 2016-09-14 ENCOUNTER — Encounter: Payer: Self-pay | Admitting: Family Medicine

## 2016-09-14 ENCOUNTER — Ambulatory Visit (INDEPENDENT_AMBULATORY_CARE_PROVIDER_SITE_OTHER): Payer: Managed Care, Other (non HMO) | Admitting: Family Medicine

## 2016-09-14 VITALS — Ht 65.5 in

## 2016-09-14 DIAGNOSIS — Z0000 Encounter for general adult medical examination without abnormal findings: Secondary | ICD-10-CM

## 2016-09-14 NOTE — Progress Notes (Signed)
Pre visit review using our clinic review tool, if applicable. No additional management support is needed unless otherwise documented below in the visit note. 

## 2016-09-18 NOTE — Progress Notes (Signed)
Cancelled appt

## 2016-10-12 ENCOUNTER — Telehealth: Payer: Self-pay | Admitting: Internal Medicine

## 2016-10-14 ENCOUNTER — Encounter: Payer: Self-pay | Admitting: Internal Medicine

## 2016-10-15 NOTE — Telephone Encounter (Signed)
Called and spoke to pt. Pt states she is not sleeping well with current CPAP settings, pt states because the pressure feels too strong and then at times she cannot feel the pressure at all. Pt is requesting recs.   Will forward to [facility] to see if a DL can be printed and given to provider.

## 2016-10-15 NOTE — Telephone Encounter (Signed)
I have printed a compliance report and placed it in your folder to look at. Please advise. Thanks.

## 2016-10-16 NOTE — Telephone Encounter (Signed)
The machine is on the lowest possible pressure, but her download shows that her OSA is adequately treated at this pressure. She needs to use it more often to get used to it. Sometimes she can not feel the pressure because she is getting used to it, she should use it more often so that it feels that way all the time.

## 2016-10-16 NOTE — Telephone Encounter (Signed)
LMOM for pt to return call. 

## 2016-10-16 NOTE — Telephone Encounter (Signed)
Spoke with pt and she states since hosp d/c that she hasn't been able to use her CPAP machine. She says she took her machine to [place] and they state they have made the changes that were ordered. I don't see any orders for any changes for the CPAP. Informed pt to let me call [employer] and f/u and then I will call her back.

## 2016-10-18 NOTE — Telephone Encounter (Signed)
Spoke with Maudie Mercury at [place] and she states she isn't sure what "flap" pt is referring to but she will call her.Kim suggest was to see if we can put pt's CPAP on an auto setting to see if that makes a difference. Please advise.

## 2016-10-18 NOTE — Telephone Encounter (Signed)
Called [employer] and spoke with Maudie Mercury.  Maudie Mercury stated that patient brought her machine in c/o that she wasn't getting enough air.  Kim fixed her mask, since patient was c/o about not getting enough air. Kim sent a fax over requesting to change patient to auto, however, [facility] never received change request back.  Pt's AHI is 1.5 on a setting of 5 cm. No changes have been made to her machine. Her pressure is still on 5 cm.  [name] J Cobb

## 2016-10-18 NOTE — Telephone Encounter (Signed)
Misty please advise if this has been taken care of. Thanks.

## 2016-10-18 NOTE — Telephone Encounter (Signed)
Spoke with pt and informed her of the response from [place] at [place]. Pt states while we are on the phone that her mask has a flap about the size of a dime that she says sometimes opens and closes but most of the time doesn't open. Informed pt that I would contact Kim at [place] back and ask about the flap. Pt states if it needs to be removed she wants to know what to do to remove it otherwise if it needs to stay the pt is asking if we can do an auto setting on her CPAP. Waiting on Kim from [place] to return call and the  Will proceed from there.

## 2016-10-18 NOTE — Telephone Encounter (Signed)
Pt brought in machine to [place] on 09/06/16 and pt saw Maudie Mercury at [place]. [name] J Cobb

## 2016-10-22 NOTE — Telephone Encounter (Signed)
Spoke with pt and she states that Maudie Mercury with [facility] did give her a different tubing and that the piece is not "flapping" like the other one did. States she feels the air now and thinks this is better. Will call back with any further issues.

## 2016-10-22 NOTE — Telephone Encounter (Signed)
This does not sound like a problem with the settings, she needs to take issue resolved with [employer].

## 2016-10-30 ENCOUNTER — Other Ambulatory Visit: Payer: Self-pay | Admitting: Family Medicine

## 2016-10-30 NOTE — Telephone Encounter (Signed)
Last office visit 09/14/2016.  Last Vit D level 02/09/2014.  Refill?

## 2016-12-06 ENCOUNTER — Telehealth: Payer: Self-pay | Admitting: Family Medicine

## 2016-12-06 NOTE — Telephone Encounter (Signed)
Patient Name: [name] [name]  DOB: January 17, 1961    Initial Comment Caller states she's really dizzy, stumbling. She says she may be dehydrated. She has been exhausted just climbing the stairs. Only urinating 2 times a day.   Nurse Assessment  Nurse: Orvan Seen, RN, Jacquilin Date/Time (Eastern Time): 12/06/2016 1:47:52 PM  Confirm and document reason for call. If symptomatic, describe symptoms. ---Caller states she's really dizzy, stumbling. She says she may be dehydrated. She has been exhausted just climbing the stairs. Only urinating 2 times a day.- Symptoms started about 1.5 weeks ago.  Does the patient have any new or worsening symptoms? ---Yes  Will a triage be completed? ---Yes  Related visit to physician within the last 2 weeks? ---No  Does the PT have any chronic conditions? (i.e. diabetes, asthma, etc.) ---Yes  List chronic conditions. ---sleep apnea  Is this a behavioral health or substance abuse call? ---No     Guidelines    Guideline Title Affirmed Question Affirmed Notes  Dizziness - Lightheadedness Difficulty breathing    Final Disposition User   Go to ED Now Orvan Seen, RN, Jacquilin    Comments  Caller states she will go to the UC later this afternoon   Referrals  GO TO FACILITY REFUSED   Disagree/Comply: Disagree  Disagree/Comply Reason: Wait and see

## 2016-12-06 NOTE — Telephone Encounter (Signed)
Unable to reach pt by phone, This call did not come to my desk top.FYI to Dr Diona Browner.

## 2017-01-16 ENCOUNTER — Telehealth: Payer: Self-pay | Admitting: Internal Medicine

## 2017-01-16 MED ORDER — BENZONATATE 100 MG PO CAPS: 100.0000 mg | ORAL_CAPSULE | Freq: Three times a day (TID) | ORAL | 0 refills | Status: DC | PRN

## 2017-01-16 MED ORDER — GUAIFENESIN-CODEINE 100-10 MG/5ML PO SYRP
5.0000 mL | ORAL_SOLUTION | ORAL | 0 refills | Status: DC | PRN
Start: 2017-01-16 — End: 2017-01-18

## 2017-01-16 NOTE — Telephone Encounter (Signed)
Pt would like a provider to call her back. She needs a stonger cough medication. She does not want to talk to anyone but a provider. Pt asks that the nurse never call her again.

## 2017-01-16 NOTE — Telephone Encounter (Signed)
Spoke with DK and he states to send in [place] 100mg  TID PRN. RX sent.  Called pt to inform her that DK was sending in [employer] to her pharmacy. Pt asked if we knew why she had the cough and if it can come from her CPAP. Informed pt I had not ever heard a pt getting a cough from a CPAP and that we didn't know why she had the cough because we have not seen her since 02/2016. Pt got upset and wanted to know why we are giving her a medication and started stating that we are rude and don't care about our patients. I told patient I was sorry that she felt that way and we could schedule her an appt. She then informed me that she had already been to an UC and was treated and that the cough syrup they gave her was not working. I informed the patient again that we were sending in the med for the cough she states she is having. She then stated for [place] to just send in the medication and hung up.

## 2017-01-16 NOTE — Telephone Encounter (Signed)
Spoke with pt and informed her that DK will give her an RX for Guaifensenin-Codeine and that we can see her 01/18/17 @12pm . Pt denied appt but agreed on the RX and was informed she has to pick up the RX from up front. Offered an appt with Wert at [place] on 01/18/17 @3 :15pm but pt then stated she couldn't be there because she has another appt at 4pm. Pt decided to take the appt in [place] on 01/18/17 @12pm . Nothing further needed.

## 2017-01-18 ENCOUNTER — Ambulatory Visit (INDEPENDENT_AMBULATORY_CARE_PROVIDER_SITE_OTHER): Payer: Managed Care, Other (non HMO) | Admitting: Internal Medicine

## 2017-01-18 ENCOUNTER — Encounter: Payer: Self-pay | Admitting: Internal Medicine

## 2017-01-18 ENCOUNTER — Ambulatory Visit: Payer: Managed Care, Other (non HMO) | Admitting: Internal Medicine

## 2017-01-18 VITALS — BP 110/68 | HR 85 | Ht 65.5 in | Wt 155.4 lb

## 2017-01-18 DIAGNOSIS — R059 Cough, unspecified: Secondary | ICD-10-CM

## 2017-01-18 DIAGNOSIS — J040 Acute laryngitis: Secondary | ICD-10-CM

## 2017-01-18 DIAGNOSIS — R05 Cough: Secondary | ICD-10-CM | POA: Diagnosis not present

## 2017-01-18 MED ORDER — PREDNISONE 20 MG PO TABS: ORAL_TABLET | ORAL | 0 refills | Status: DC

## 2017-01-18 NOTE — Patient Instructions (Signed)
Prednisone 40 mg daily for 7 days ENt referral in [place]

## 2017-01-18 NOTE — Progress Notes (Signed)
*   [facility]       Date: 01/18/2017  MRN# [id number] [name] [name] 1960/12/29   [name] [name] is a 56 y.o. old female seen in follow up for chief complaint of  Chief Complaint  Patient presents with  . Acute Visit    pt states she developed non prod cough x8d cough subsided yesterday after voice hoarseness occured. pt did take some left over cough syrup yesterday.     HPI:  Has OSA followed by Dr. Juanell Fairly  Has been having cough x 8 days Has had chills, lost voice 2 days ago, hoarseness no pain  No SOB, DOE  Office SPiro shows NL spirometry-no obstruction no restriction  Review of Systems: Gen:  Denies  fever, sweats. +chills HEENT: Denies blurred vision. Cvc:  No dizziness, chest pain or heaviness Resp:   + cough  +voice hoareness  Other:  All other systems were reviewed and found to be negative other than what is mentioned in the HPI.   Physical Examination:   VS: BP 110/68 (BP Location: Left Arm, Cuff Size: Normal)   Pulse 85   Ht 5' 5.5" (1.664 m)   Wt 155 lb 6.4 oz (70.5 kg)   SpO2 99%   BMI 25.47 kg/m   General Appearance: No distress  Neuro:without focal findings,  speech normal,  HEENT: PERRLA, EOM intact. No stridor Pulmonary: normal breath sounds, No wheezing.   CardiovascularNormal S1,S2.  No m/r/g.      Assessment and Plan:  56 yo AAF with h/o OSA with acute signs and symptoms of cough and hoarse voice with c/w  laryngitis  1.start Prednisone 40 mg daily for 7 days 2. Referral to ENT in [place] as patient request   Follow up with Dr. Juanell Fairly for OSA   Patient  satisfied with Plan of action and management. All questions answered  Corrin Parker, M.D.  [facility]  Medical Director [place] Director [facility] Cardio-Pulmonary Department

## 2017-01-21 ENCOUNTER — Ambulatory Visit: Payer: Managed Care, Other (non HMO) | Admitting: Internal Medicine

## 2017-02-26 ENCOUNTER — Institutional Professional Consult (permissible substitution): Payer: Managed Care, Other (non HMO) | Admitting: Pulmonary Disease

## 2017-04-15 ENCOUNTER — Other Ambulatory Visit: Payer: Managed Care, Other (non HMO)

## 2017-04-15 ENCOUNTER — Telehealth: Payer: Self-pay | Admitting: Family Medicine

## 2017-04-15 DIAGNOSIS — E782 Mixed hyperlipidemia: Secondary | ICD-10-CM

## 2017-04-15 DIAGNOSIS — E559 Vitamin D deficiency, unspecified: Secondary | ICD-10-CM

## 2017-04-15 NOTE — Telephone Encounter (Signed)
-----   Message from Ellamae Sia sent at 04/12/2017  9:08 AM EDT ----- Regarding: Lab orders for Monday, 7.9.18 Patient is scheduled for CPX labs, please order future labs, Thanks , Karna Christmas

## 2017-04-16 ENCOUNTER — Other Ambulatory Visit (INDEPENDENT_AMBULATORY_CARE_PROVIDER_SITE_OTHER): Payer: Managed Care, Other (non HMO)

## 2017-04-16 DIAGNOSIS — E782 Mixed hyperlipidemia: Secondary | ICD-10-CM

## 2017-04-16 DIAGNOSIS — E559 Vitamin D deficiency, unspecified: Secondary | ICD-10-CM

## 2017-04-16 LAB — COMPREHENSIVE METABOLIC PANEL
ALBUMIN: 4.6 g/dL (ref 3.5–5.2)
ALT: 14 U/L (ref 0–35)
AST: 18 U/L (ref 0–37)
Alkaline Phosphatase: 78 U/L (ref 39–117)
BUN: 17 mg/dL (ref 6–23)
CALCIUM: 10.3 mg/dL (ref 8.4–10.5)
CHLORIDE: 102 meq/L (ref 96–112)
CO2: 29 meq/L (ref 19–32)
Creatinine, Ser: 0.89 mg/dL (ref 0.40–1.20)
GFR: 84.54 mL/min (ref >60.00)
Glucose, Bld: 94 mg/dL (ref 70–99)
POTASSIUM: 3.8 meq/L (ref 3.5–5.1)
Sodium: 138 mEq/L (ref 135–145)
Total Bilirubin: 0.6 mg/dL (ref 0.2–1.2)
Total Protein: 8 g/dL (ref 6.0–8.3)

## 2017-04-16 LAB — LIPID PANEL
CHOL/HDL RATIO: 4
CHOLESTEROL: 225 mg/dL — AB (ref 0–200)
HDL: 51.9 mg/dL (ref >39.00)
LDL CALC: 145 mg/dL — AB (ref 0–99)
NonHDL: 173.21
TRIGLYCERIDES: 143 mg/dL (ref 0.0–149.0)
VLDL: 28.6 mg/dL (ref 0.0–40.0)

## 2017-04-16 LAB — VITAMIN D 25 HYDROXY (VIT D DEFICIENCY, FRACTURES): VITD: 37.33 ng/mL (ref 30.00–100.00)

## 2017-04-17 ENCOUNTER — Encounter: Payer: Self-pay | Admitting: *Deleted

## 2017-04-17 ENCOUNTER — Other Ambulatory Visit: Payer: Self-pay | Admitting: *Deleted

## 2017-04-17 LAB — HEPATITIS C ANTIBODY: HCV AB: NEGATIVE

## 2017-04-19 ENCOUNTER — Ambulatory Visit (INDEPENDENT_AMBULATORY_CARE_PROVIDER_SITE_OTHER): Payer: Managed Care, Other (non HMO) | Admitting: Family Medicine

## 2017-04-19 ENCOUNTER — Encounter: Payer: Self-pay | Admitting: Family Medicine

## 2017-04-19 VITALS — BP 90/60 | HR 64 | Temp 97.9°F | Ht 65.0 in | Wt 151.0 lb

## 2017-04-19 DIAGNOSIS — Z0000 Encounter for general adult medical examination without abnormal findings: Secondary | ICD-10-CM | POA: Diagnosis not present

## 2017-04-19 DIAGNOSIS — J4520 Mild intermittent asthma, uncomplicated: Secondary | ICD-10-CM | POA: Diagnosis not present

## 2017-04-19 DIAGNOSIS — E782 Mixed hyperlipidemia: Secondary | ICD-10-CM | POA: Diagnosis not present

## 2017-04-19 DIAGNOSIS — E559 Vitamin D deficiency, unspecified: Secondary | ICD-10-CM

## 2017-04-19 NOTE — Assessment & Plan Note (Signed)
Rare flare due to environmental factors.

## 2017-04-19 NOTE — Assessment & Plan Note (Signed)
Resolved on supplement 

## 2017-04-19 NOTE — Assessment & Plan Note (Signed)
Work on low cholesterol diet and restart exercise.  No current indication for medication.

## 2017-04-19 NOTE — Patient Instructions (Signed)
Get back to regular exercise as able, 3-5 days a week.  Work on low cholesterol diet.

## 2017-04-19 NOTE — Progress Notes (Signed)
Subjective:    Patient ID: [name] [name], female    DOB: March 13, 1961, 56 y.o.   MRN: [id number]  HPI  The patient is here for annual wellness exam and preventative care.    GYN: women's [facility], Dr. Ulanda Edison.  Mild intermittent asthma: stable on no controllers, rare flare form environmental trigger.  Vit D def:  Nml, just restarted vit D supplement  Elevated Cholesterol:  Inadequate control on no medication but her 10 year AHA risk per calculator given low risk is 1.2% Lab Results  Component Value Date   CHOL 225 (H) 04/16/2017   HDL 51.90 04/16/2017   LDLCALC 145 (H) 04/16/2017   LDLDIRECT 126.6 12/22/2012   TRIG 143.0 04/16/2017   CHOLHDL 4 04/16/2017  Diet compliance:Good, water ,veggies. Exercise: none  Other complaints:   Social History /Family History/Past Medical History reviewed in detail and updated in EMR if needed. Blood pressure 90/60, pulse 64, temperature 97.9 F (36.6 C), temperature source Oral, height 5\' 5"  (1.651 m), weight 151 lb (68.5 kg).  Review of Systems  Constitutional: Negative for fatigue and fever.  HENT: Negative for congestion.   Eyes: Negative for pain.  Respiratory: Negative for cough and shortness of breath.   Cardiovascular: Negative for chest pain, palpitations and leg swelling.  Gastrointestinal: Negative for abdominal pain.  Genitourinary: Negative for dysuria and vaginal bleeding.  Musculoskeletal: Negative for back pain.  Neurological: Negative for syncope, light-headedness and headaches.  Psychiatric/Behavioral: Negative for dysphoric mood.       Objective:   Physical Exam  Constitutional: Vital signs are normal. She appears well-developed and well-nourished. She is cooperative.  Non-toxic appearance. She does not appear ill. No distress.  HENT:  Head: Normocephalic.  Right Ear: Hearing, tympanic membrane, external ear and ear canal normal. Tympanic membrane is not erythematous, not retracted and not bulging.  Left Ear:  Hearing, tympanic membrane, external ear and ear canal normal. Tympanic membrane is not erythematous, not retracted and not bulging.  Nose: Nose normal. No mucosal edema or rhinorrhea. Right sinus exhibits no maxillary sinus tenderness and no frontal sinus tenderness. Left sinus exhibits no maxillary sinus tenderness and no frontal sinus tenderness.  Mouth/Throat: Uvula is midline, oropharynx is clear and moist and mucous membranes are normal.  Eyes: Pupils are equal, round, and reactive to light. Conjunctivae, EOM and lids are normal. Lids are everted and swept, no foreign bodies found.  Neck: Trachea normal and normal range of motion. Neck supple. Carotid bruit is not present. No thyroid mass and no thyromegaly present.  Cardiovascular: Normal rate, regular rhythm, S1 normal, S2 normal, normal heart sounds, intact distal pulses and normal pulses.  Exam reveals no gallop and no friction rub.   No murmur heard. Pulmonary/Chest: Effort normal and breath sounds normal. No tachypnea. No respiratory distress. She has no decreased breath sounds. She has no wheezes. She has no rhonchi. She has no rales.  Abdominal: Soft. Normal appearance and bowel sounds are normal. She exhibits no distension, no fluid wave, no abdominal bruit and no mass. There is no hepatosplenomegaly. There is no tenderness. There is no rebound, no guarding and no CVA tenderness. No hernia.  Lymphadenopathy:    She has no cervical adenopathy.    She has no axillary adenopathy.  Neurological: She is alert. She has normal strength. No cranial nerve deficit or sensory deficit.  Skin: Skin is warm, dry and intact. No rash noted.  Psychiatric: Her speech is normal and behavior is normal. Judgment and thought content  normal. Her mood appears not anxious. Cognition and memory are normal. She does not exhibit a depressed mood.          Assessment & Plan:  The patient's preventative maintenance and recommended screening tests for an  annual wellness exam were reviewed in full today. Brought up to date unless services declined.  Counselled on the importance of diet, exercise, and its role in overall health and mortality. The patient's FH and SH was reviewed, including their home life, tobacco status, and drug and alcohol status.     Former smoker GYN: Dr. Ulanda Edison Mammogram:  Due plans to do, GYN office. Vaccine: Uptodate with Tdap. Colon: 11/2012, Dr. Fuller Plan polyps, repeat in 5 years.  Hep C: negative

## 2017-04-22 ENCOUNTER — Encounter: Payer: Self-pay | Admitting: Family Medicine

## 2017-05-29 LAB — HM PAP SMEAR: HM PAP: NEGATIVE

## 2017-05-30 ENCOUNTER — Other Ambulatory Visit: Payer: Self-pay | Admitting: Family Medicine

## 2017-05-30 NOTE — Telephone Encounter (Signed)
[name] [name] notified by telephone that refills have been sent into her pharmacy.

## 2017-05-30 NOTE — Telephone Encounter (Signed)
Last office visit 04/19/2017.  Last refilled: Ibuprofen-Not on current medication list.  Vit D 10/30/2016 for #12 with no refills.  Last Vit D level normal at 37.33 ng/dl.. Refill?

## 2017-05-30 NOTE — Telephone Encounter (Signed)
Pt left v/m; pt is going out of town early morning on 05/31/17 and pt wants to pick up refills requested by [facility] today.Please advise.

## 2017-07-14 ENCOUNTER — Other Ambulatory Visit: Payer: Self-pay | Admitting: Family Medicine

## 2017-07-14 NOTE — Telephone Encounter (Signed)
Last office visit 04/19/17. Last refilled 05/30/17 for #30.  Refill?

## 2017-09-03 ENCOUNTER — Ambulatory Visit (INDEPENDENT_AMBULATORY_CARE_PROVIDER_SITE_OTHER): Payer: Managed Care, Other (non HMO) | Admitting: Family Medicine

## 2017-09-03 ENCOUNTER — Encounter: Payer: Self-pay | Admitting: Family Medicine

## 2017-09-03 VITALS — BP 108/70 | HR 75 | Temp 98.2°F | Wt 155.6 lb

## 2017-09-03 DIAGNOSIS — J069 Acute upper respiratory infection, unspecified: Secondary | ICD-10-CM | POA: Diagnosis not present

## 2017-09-03 DIAGNOSIS — B9789 Other viral agents as the cause of diseases classified elsewhere: Secondary | ICD-10-CM

## 2017-09-03 NOTE — Patient Instructions (Signed)
Upper Respiratory Infection, Adult Most upper respiratory infections (URIs) are a viral infection of the air passages leading to the lungs. A URI affects the nose, throat, and upper air passages. The most common type of URI is nasopharyngitis and is typically referred to as "the common cold." URIs run their course and usually go away on their own. Most of the time, a URI does not require medical attention, but sometimes a bacterial infection in the upper airways can follow a viral infection. This is called a secondary infection. Sinus and middle ear infections are common types of secondary upper respiratory infections. Bacterial pneumonia can also complicate a URI. A URI can worsen asthma and chronic obstructive pulmonary disease (COPD). Sometimes, these complications can require emergency medical care and may be life threatening. What are the causes? Almost all URIs are caused by viruses. A virus is a type of germ and can spread from one person to another. What increases the risk? You may be at risk for a URI if:  You smoke.  You have chronic heart or lung disease.  You have a weakened defense (immune) system.  You are very young or very old.  You have nasal allergies or asthma.  You work in crowded or poorly ventilated areas.  You work in health care facilities or schools.  What are the signs or symptoms? Symptoms typically develop 2-3 days after you come in contact with a cold virus. Most viral URIs last 7-10 days. However, viral URIs from the influenza virus (flu virus) can last 14-18 days and are typically more severe. Symptoms may include:  Runny or stuffy (congested) nose.  Sneezing.  Cough.  Sore throat.  Headache.  Fatigue.  Fever.  Loss of appetite.  Pain in your forehead, behind your eyes, and over your cheekbones (sinus pain).  Muscle aches.  How is this diagnosed? Your health care provider may diagnose a URI by:  Physical exam.  Tests to check that your  symptoms are not due to another condition such as: ? Strep throat. ? Sinusitis. ? Pneumonia. ? Asthma.  How is this treated? A URI goes away on its own with time. It cannot be cured with medicines, but medicines may be prescribed or recommended to relieve symptoms. Medicines may help:  Reduce your fever.  Reduce your cough.  Relieve nasal congestion.  Follow these instructions at home:  Take medicines only as directed by your health care provider.  Gargle warm saltwater or take cough drops to comfort your throat as directed by your health care provider.  Use a warm mist humidifier or inhale steam from a shower to increase air moisture. This may make it easier to breathe.  Drink enough fluid to keep your urine clear or pale yellow.  Eat soups and other clear broths and maintain good nutrition.  Rest as needed.  Return to work when your temperature has returned to normal or as your health care provider advises. You may need to stay home longer to avoid infecting others. You can also use a face mask and careful hand washing to prevent spread of the virus.  Increase the usage of your inhaler if you have asthma.  Do not use any tobacco products, including cigarettes, chewing tobacco, or electronic cigarettes. If you need help quitting, ask your health care provider. How is this prevented? The best way to protect yourself from getting a cold is to practice good hygiene.  Avoid oral or hand contact with people with cold symptoms.  Wash your   hands often if contact occurs.  There is no clear evidence that vitamin C, vitamin E, echinacea, or exercise reduces the chance of developing a cold. However, it is always recommended to get plenty of rest, exercise, and practice good nutrition. Contact a health care provider if:  You are getting worse rather than better.  Your symptoms are not controlled by medicine.  You have chills.  You have worsening shortness of breath.  You have  brown or red mucus.  You have yellow or brown nasal discharge.  You have pain in your face, especially when you bend forward.  You have a fever.  You have swollen neck glands.  You have pain while swallowing.  You have white areas in the back of your throat. Get help right away if:  You have severe or persistent: ? Headache. ? Ear pain. ? Sinus pain. ? Chest pain.  You have chronic lung disease and any of the following: ? Wheezing. ? Prolonged cough. ? Coughing up blood. ? A change in your usual mucus.  You have a stiff neck.  You have changes in your: ? Vision. ? Hearing. ? Thinking. ? Mood. This information is not intended to replace advice given to you by your health care provider. Make sure you discuss any questions you have with your health care provider. Document Released: 03/20/2001 Document Revised: 05/27/2016 Document Reviewed: 12/30/2013 Elsevier Interactive Patient Education  2017 [employer]  

## 2017-09-03 NOTE — Progress Notes (Signed)
Subjective:     Patient ID: [name] [name], female   DOB: 11/16/1960, 56 y.o.   MRN: [id number]  HPI Patient seen as a work in with onset last Friday of sore throat, nasal congestion, cough, and fatigue. No fevers or chills. She's been taking some over-the-counter NyQuil and DayQuil with some relief of symptoms. Denies any nausea, vomiting, or diarrhea. No known sick contacts.  No past medical history on file. Past Surgical History:  Procedure Laterality Date  . COLONOSCOPY    . GANGLION CYST EXCISION  11/2005    reports that she quit smoking about 5 years ago. Her smoking use included cigarettes. She has a 5.00 pack-year smoking history. she has never used smokeless tobacco. She reports that she drinks alcohol. She reports that she does not use drugs. family history includes COPD in her father; Cancer in her mother; Colon cancer in her maternal aunt and mother; Heart disease in her father, sister, and sister; Hypertension in her mother; Stroke in her sister and sister. Allergies  Allergen Reactions  . Augmentin [Amoxicillin-Pot Clavulanate] Nausea And Vomiting  . Doxycycline   . Sulfa Antibiotics      Review of Systems  Constitutional: Positive for fatigue. Negative for chills and fever.  HENT: Positive for congestion and sore throat. Negative for sinus pain.   Respiratory: Positive for cough. Negative for shortness of breath and wheezing.   Cardiovascular: Negative for chest pain.       Objective:   Physical Exam  Constitutional: She appears well-developed and well-nourished.  HENT:  Right Ear: External ear normal.  Left Ear: External ear normal.  Mouth/Throat: Oropharynx is clear and moist.  Neck: Neck supple.  Cardiovascular: Normal rate and regular rhythm.  Pulmonary/Chest: Effort normal and breath sounds normal. No respiratory distress. She has no wheezes. She has no rales.  Lymphadenopathy:    She has no cervical adenopathy.       Assessment:     Probable viral  URI with cough. Nonfocal exam    Plan:     -Recommend symptomatic measures with increase fluids, Tylenol or Motrin as needed for body aches and continue NyQuil at night for cough relief as needed -Follow-up promptly for any fever or worsening symptoms  Eulas Post MD [facility] at [facility]

## 2017-09-09 ENCOUNTER — Telehealth: Payer: Self-pay | Admitting: Family Medicine

## 2017-09-09 NOTE — Telephone Encounter (Signed)
Don't normally use prednisone for viral URIs but if she has developed any wheezing-may start Prednisone 10 mg taper 4-4-4-3-3-2-2  #22

## 2017-09-09 NOTE — Telephone Encounter (Signed)
Copied from [place] [id number]. Topic: Quick Communication - See Telephone Encounter >> Sep 09, 2017 10:23 AM Robina Ade, Helene Kelp D wrote: CRM for notification. See Telephone encounter for: 09/09/17. Patient called and is still not feeling well and want to know if Dr. Elease Hashimoto can call her in prednisone to her pharmacy [id number] - [place], [place]. Please call patient back, thanks.

## 2017-09-09 NOTE — Telephone Encounter (Signed)
Copied from [place] [id number]. Topic: Quick Communication - See Telephone Encounter >> Sep 09, 2017 10:23 AM Robina Ade, Helene Kelp D wrote: CRM for notification. See Telephone encounter for: 09/09/17. Patient called and is still not feeling well and want to know if Dr. Elease Hashimoto can call her in prednisone to her pharmacy [id number] - [place], [place]. Please call patient back, thanks. >> Sep 09, 2017  4:35 PM Conception Chancy, NT wrote: Pt is calling back in regards to this

## 2017-09-10 MED ORDER — AZITHROMYCIN 250 MG PO TABS: ORAL_TABLET | ORAL | 0 refills | Status: DC

## 2017-09-10 NOTE — Telephone Encounter (Signed)
Left message on machine for patient to return our call 

## 2017-09-10 NOTE — Telephone Encounter (Signed)
Rx sent 

## 2017-09-10 NOTE — Telephone Encounter (Signed)
Most of the time productive coughs are viral.  However, if she feels worse, OK to send in rx for Zithromax 250 mg two on day 1 and then one daily for 4 more days.

## 2017-09-10 NOTE — Telephone Encounter (Signed)
Pt returning call for [facility]

## 2017-09-10 NOTE — Telephone Encounter (Signed)
Patient states that she is "now coughing up green mucous".  She denies any fever or wheezing.  Patient is requesting an antibiotic.  A office visit was offered but the patient declined.

## 2017-12-02 ENCOUNTER — Encounter: Payer: Self-pay | Admitting: Gastroenterology

## 2017-12-09 ENCOUNTER — Ambulatory Visit: Payer: Self-pay

## 2017-12-09 NOTE — Telephone Encounter (Signed)
Pt having complaints of urgency and burning with urination. Pt denies any blood in urine and denies urine having an odor. Pt informed that she would need to come in to have an OV in order to give urine sample. Pt spoke with one of the agents and the pt states she would not be able to come in for a visit today. Pt offered to make appt on tomorrow but states she will not be able to come in due to having to attend a funeral at the available appt time. Pt states she will go to Urgent Care to have treatment of current symptoms.  Reason for Disposition . Urinating more frequently than usual (i.e., frequency)  Answer Assessment - Initial Assessment Questions 1. SYMPTOM: "What's the main symptom you're concerned about?" (e.g., frequency, incontinence)     Urgency and burning  2. ONSET: "When did the  ________  start?"      3. PAIN: "Is there any pain?" If so, ask: "How bad is it?" (Scale: 1-10; mild, moderate, severe)     Burning with urination 4. CAUSE: "What do you think is causing the symptoms?"     UTI 5. OTHER SYMPTOMS: "Do you have any other symptoms?" (e.g., fever, flank pain, blood in urine, pain with urination)    Burning with urination, no blood or odor 6. PREGNANCY: "Is there any chance you are pregnant?" "When was your last menstrual period?" Not accessed  Protocols used: URINARY [facility]

## 2018-01-27 ENCOUNTER — Ambulatory Visit (INDEPENDENT_AMBULATORY_CARE_PROVIDER_SITE_OTHER): Payer: Managed Care, Other (non HMO) | Admitting: Family Medicine

## 2018-01-27 ENCOUNTER — Encounter: Payer: Self-pay | Admitting: Family Medicine

## 2018-01-27 DIAGNOSIS — N644 Mastodynia: Secondary | ICD-10-CM | POA: Diagnosis not present

## 2018-01-27 NOTE — Progress Notes (Signed)
H/o mastitis in the past, years ago, unilateral.    Now with B pain at the nipples w/o acute skin changes o/w.  Started about 2 weeks ago.  No nipple discharge.  No breast lumps or masses.  She doesn't have breast pain.  Due for mammogram in 04/2018.   She sees gyn yearly, Dr. Ulanda Edison.  1st cousin with breast cancer.  No new bras, etc.  No clear trigger for the symptoms.  She started menopause about 4-5 years ago.  No VB.  No topical meds tried.  She has been in a relationship recently and had increased sexual activity, unclear if that was associated with the symptoms described above.  No FCNAVD.  No other complaints.    Meds, vitals, and allergies reviewed.   ROS: Per HPI unless specifically indicated in ROS section   GEN: nad, alert and oriented NECK: supple w/o LA CV: rrr PULM: ctab, no inc wob EXT: no edema Chaperoned exam.  Normal B breast exam wo LA or mass. No acute nipple changes, but she has old scarring and chronic changes noted from previous episode of mastitis on the left breast-this is not changed.  No discharge.

## 2018-01-27 NOTE — Patient Instructions (Addendum)
I would use OTC lanolin and I'll update your PCP and gyn clinic.  It doesn't look likely anything alarming and we don't need to send you for a mammogram today.   Take care.  Glad to see you.

## 2018-01-28 DIAGNOSIS — N644 Mastodynia: Secondary | ICD-10-CM | POA: Insufficient documentation

## 2018-01-28 DIAGNOSIS — R079 Chest pain, unspecified: Secondary | ICD-10-CM | POA: Insufficient documentation

## 2018-01-28 NOTE — Assessment & Plan Note (Signed)
Bilateral, without mass or lump found by patient or me on chaperoned exam.  No red flag symptoms otherwise.  Discussed with patient about options.  I will route this over to PCP and to her gynecologist.  Reasonable to try topical treatment with lanolin in the meantime.  It does not appear that she needs imaging at this point.  Discussed with patient.

## 2018-02-18 ENCOUNTER — Encounter: Payer: Self-pay | Admitting: *Deleted

## 2018-02-19 ENCOUNTER — Other Ambulatory Visit: Payer: Self-pay | Admitting: *Deleted

## 2018-02-20 ENCOUNTER — Encounter: Payer: Self-pay | Admitting: Family Medicine

## 2018-02-20 ENCOUNTER — Ambulatory Visit (INDEPENDENT_AMBULATORY_CARE_PROVIDER_SITE_OTHER): Payer: Managed Care, Other (non HMO) | Admitting: Family Medicine

## 2018-02-20 VITALS — BP 110/70 | HR 68 | Temp 97.5°F | Ht 65.0 in | Wt 151.5 lb

## 2018-02-20 DIAGNOSIS — B349 Viral infection, unspecified: Secondary | ICD-10-CM | POA: Diagnosis not present

## 2018-02-20 DIAGNOSIS — N898 Other specified noninflammatory disorders of vagina: Secondary | ICD-10-CM | POA: Insufficient documentation

## 2018-02-20 DIAGNOSIS — R350 Frequency of micturition: Secondary | ICD-10-CM

## 2018-02-20 LAB — POC URINALSYSI DIPSTICK (AUTOMATED)
BILIRUBIN UA: NEGATIVE
Blood, UA: NEGATIVE
Glucose, UA: NEGATIVE
Ketones, UA: NEGATIVE
NITRITE UA: NEGATIVE
PH UA: 7 (ref 5.0–8.0)
PROTEIN UA: NEGATIVE
Spec Grav, UA: 1.015 (ref 1.010–1.025)
Urobilinogen, UA: 0.2 E.U./dL

## 2018-02-20 LAB — POCT UA - MICROSCOPIC ONLY
Bacteria, U Microscopic: 0
Casts, Ur, LPF, POC: 0
Epithelial cells, urine per micros: 0
RBC, URINE, MICROSCOPIC: 0
WBC, Ur, HPF, POC: 0
Yeast, UA: 0

## 2018-02-20 NOTE — Progress Notes (Signed)
Subjective:    Patient ID: [name] [name], female    DOB: [birth year], 57 y.o.   MRN: [id number]  Urinary Frequency   This is a new problem. The current episode started in the past 7 days. The problem occurs intermittently. The pain is at a severity of 4/10. There has been no fever. Associated symptoms include chills, a discharge, frequency and urgency. Pertinent negatives include no hematuria or hesitancy. Associated symptoms comments:  weak and lightheaded  dull frontal headache decreased appetite  loose stool in last 2 days  chills initially   Fatigue  no myalgia  has had some yellow discharge, no vaginal itching.. She has tried increased fluids for the symptoms. The treatment provided no relief. There is no history of catheterization, kidney stones, recurrent UTIs, urinary stasis or a urological procedure.     Saw Urgent Care on Sunday. Treated for migrane and UTI.  Told she was dehydrated.  Given toradol and benadryl.  Urine culture was sent... still pending.  Blood pressure 110/70, pulse 68, temperature (!) 97.5 F (36.4 C), temperature source Oral, height 5\' 5"  (1.651 m), weight 151 lb 8 oz (68.7 kg), SpO2 99 %.  Review of Systems  Constitutional: Positive for chills.  Genitourinary: Positive for frequency and urgency. Negative for hematuria and hesitancy.       Objective:   Physical Exam  Constitutional: Vital signs are normal. She appears well-developed and well-nourished. She is cooperative.  Non-toxic appearance. She does not appear ill. No distress.  HENT:  Head: Normocephalic.  Right Ear: Hearing, tympanic membrane, external ear and ear canal normal. Tympanic membrane is not erythematous, not retracted and not bulging.  Left Ear: Hearing, tympanic membrane, external ear and ear canal normal. Tympanic membrane is not erythematous, not retracted and not bulging.  Nose: Mucosal edema present. No rhinorrhea. Right sinus exhibits no maxillary sinus tenderness and no  frontal sinus tenderness. Left sinus exhibits no maxillary sinus tenderness and no frontal sinus tenderness.  Mouth/Throat: Uvula is midline, oropharynx is clear and moist and mucous membranes are normal.  Eyes: Pupils are equal, round, and reactive to light. Conjunctivae, EOM and lids are normal. Lids are everted and swept, no foreign bodies found.  Neck: Trachea normal and normal range of motion. Neck supple. Carotid bruit is not present. No thyroid mass and no thyromegaly present.  Cardiovascular: Normal rate, regular rhythm, S1 normal, S2 normal, normal heart sounds, intact distal pulses and normal pulses. Exam reveals no gallop and no friction rub.  No murmur heard. Pulmonary/Chest: Effort normal and breath sounds normal. No tachypnea. No respiratory distress. She has no decreased breath sounds. She has no wheezes. She has no rhonchi. She has no rales.  Abdominal: Soft. Normal appearance and bowel sounds are normal. There is no tenderness. There is no rigidity, no guarding and no CVA tenderness.  Genitourinary: Pelvic exam was performed with patient supine. There is no rash or tenderness on the right labia. There is no rash or tenderness on the left labia. Right adnexum displays no mass. Left adnexum displays no mass. No erythema, tenderness or bleeding in the vagina. No foreign body in the vagina. Vaginal discharge found.  Neurological: She is alert.  Skin: Skin is warm, dry and intact. No rash noted.  Psychiatric: Her speech is normal and behavior is normal. Judgment and thought content normal. Her mood appears not anxious. Cognition and memory are normal. She does not exhibit a depressed mood.  Assessment & Plan:

## 2018-02-20 NOTE — Assessment & Plan Note (Signed)
Nml wet prep.. Likely physiologic discharge.

## 2018-02-20 NOTE — Assessment & Plan Note (Signed)
Possible Gastroenteritis viral.. Rest, fluids and time.

## 2018-02-20 NOTE — Patient Instructions (Signed)
Continue with fluids, rest and likely expect 5-7 more days of illness.  Continue regular diet to help stools firm up. If not improving in 1 week consider follow ing up.

## 2018-03-20 ENCOUNTER — Encounter: Payer: Self-pay | Admitting: Internal Medicine

## 2018-04-04 NOTE — Progress Notes (Signed)
  [facility] Consultation      Assessment and Plan:  Obstructive sleep apnea. -HST 06/16/15: AHI 11. --Patient currently not compliant with CPAP due to sensation of not getting enough air.  Will increase CPAP pressure from 5 to a pressure of 8.  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 -Continue CPAP as above, if patient's daytime sleepiness symptoms do not resolve with adequate CPAP use, can consider adding an awakening agent such as modafinil.    Date: 04/04/2018  MRN# [id number] [name] [name] Mar 27, 1961  Referring Physician: Dr. Isidoro Donning is a 57 y.o. old female seen in consultation for possible sleep apnea.   CC:  Chief Complaint  Patient presents with  . Sleep Apnea    Pt states it feels that there is too much air blowing in her face.    HPI:  Patient is followed because of a history of obstructive sleep apnea, she has not been using her CPAP regularly. She notes that when she uses it, she feels much better during the day, she is less sleepy, and sleep better at night.   She feels that the air it hot, she has humidifier at lowest setting. She feels that the air is not coming in, and she can not breathe, so she takes the mask off.   **Download data 02/19/2018-03/20/2018>> uses greater than 4 hours is 4/30 days.  Average usage on days used for 29 minutes.  Set pressure is 5.  Residual AHI is 1.8.  Overall this shows poor compliance with excellent control of obstructive sleep apnea when the CPAP is used. **CPAP titration 04/25/2016>> required CPAP at 5. **MSLT 04/26/2016>> shortened sleep latency with 1/5 REM periods consistent with hypersomnia. **HST 06/16/2015>> moderate sleep apnea with AHI of 10.9.  Review of Systems:  Constitutional: Feels well. Cardiovascular: No chest pain.  Pulmonary: Denies dyspnea.   The remainder of systems were reviewed and were found to be negative other than  what is documented in the HPI.    Physical Examination:   VS: BP 100/60 (BP Location: Left Arm, Cuff Size: Normal)   Pulse 61   Resp 16   Ht 5\' 5"  (1.651 m)   Wt 148 lb (67.1 kg)   SpO2 100%   BMI 24.63 kg/m   General Appearance: No distress  Neuro:without focal findings, mental status, speech normal, alert and oriented HEENT: PERRLA, EOM intact Pulmonary: No wheezing, No rales  CardiovascularNormal S1,S2.  No m/r/g.  Abdomen: Benign, Soft, non-tender, No masses Renal:  No costovertebral tenderness  GU:  No performed at this time. Endoc: No evident thyromegaly, no signs of acromegaly or Cushing features Skin:   warm, no rashes, no ecchymosis  Extremities: normal, no cyanosis, clubbing.    Thank  you for the consultation and for allowing [facility] to assist in the care of your patient. Our recommendations are noted above.  Please contact us if we can be of further service.   Marda Stalker, MD.  Board Certified in Internal Medicine, Pulmonary Medicine, [place], and Sleep Medicine.  [facility] Office Number: [phone]  Patricia Pesa, M.D.  Vilinda Boehringer, M.D.  Cheral Marker, M.D

## 2018-04-07 ENCOUNTER — Ambulatory Visit (INDEPENDENT_AMBULATORY_CARE_PROVIDER_SITE_OTHER): Payer: Managed Care, Other (non HMO) | Admitting: Internal Medicine

## 2018-04-07 ENCOUNTER — Encounter: Payer: Self-pay | Admitting: Internal Medicine

## 2018-04-07 VITALS — BP 100/60 | HR 61 | Resp 16 | Ht 65.0 in | Wt 148.0 lb

## 2018-04-07 DIAGNOSIS — G4733 Obstructive sleep apnea (adult) (pediatric): Secondary | ICD-10-CM | POA: Diagnosis not present

## 2018-04-07 NOTE — Patient Instructions (Signed)
Will increase CPAP pressure to 8.

## 2018-04-07 NOTE — Addendum Note (Signed)
Addended by: Stephanie Coup on: 04/07/2018 10:24 AM   Modules accepted: Orders

## 2018-04-25 ENCOUNTER — Ambulatory Visit (INDEPENDENT_AMBULATORY_CARE_PROVIDER_SITE_OTHER): Payer: Managed Care, Other (non HMO) | Admitting: Family Medicine

## 2018-04-25 ENCOUNTER — Encounter: Payer: Self-pay | Admitting: Family Medicine

## 2018-04-25 VITALS — BP 98/70 | HR 63 | Temp 98.0°F | Ht 65.0 in | Wt 149.0 lb

## 2018-04-25 DIAGNOSIS — G4733 Obstructive sleep apnea (adult) (pediatric): Secondary | ICD-10-CM | POA: Diagnosis not present

## 2018-04-25 DIAGNOSIS — Z0000 Encounter for general adult medical examination without abnormal findings: Secondary | ICD-10-CM

## 2018-04-25 DIAGNOSIS — J4520 Mild intermittent asthma, uncomplicated: Secondary | ICD-10-CM

## 2018-04-25 DIAGNOSIS — E559 Vitamin D deficiency, unspecified: Secondary | ICD-10-CM

## 2018-04-25 DIAGNOSIS — Z130 Encounter for screening for diseases of the blood and blood-forming organs and certain disorders involving the immune mechanism: Secondary | ICD-10-CM

## 2018-04-25 DIAGNOSIS — E782 Mixed hyperlipidemia: Secondary | ICD-10-CM | POA: Diagnosis not present

## 2018-04-25 DIAGNOSIS — R7303 Prediabetes: Secondary | ICD-10-CM | POA: Diagnosis not present

## 2018-04-25 DIAGNOSIS — G4730 Sleep apnea, unspecified: Secondary | ICD-10-CM | POA: Insufficient documentation

## 2018-04-25 HISTORY — DX: Sleep apnea, unspecified: G47.30

## 2018-04-25 NOTE — Patient Instructions (Addendum)
Make lab only appt when able.

## 2018-04-25 NOTE — Assessment & Plan Note (Signed)
Well controled nw on CPAp 5-7 days a week. Followed by Dr. Ashby Dawes

## 2018-04-25 NOTE — Progress Notes (Signed)
Subjective:    Patient ID: [name] [name], female    DOB: Apr 13, 1961, 57 y.o.   MRN: [id number]  HPI  The patient is here for annual wellness exam and preventative care.    Elevated Cholesterol:  Due for re-eval. Lab Results  Component Value Date   CHOL 225 (H) 04/16/2017   HDL 51.90 04/16/2017   LDLCALC 145 (H) 04/16/2017   LDLDIRECT 126.6 12/22/2012   TRIG 143.0 04/16/2017   CHOLHDL 4 04/16/2017  Diet compliance:  Fruits and veggies, lean meat, water. Exercise: staying active working in yard, 15 min running. Other complaints: Wt Readings from Last 3 Encounters:  04/25/18 149 lb (67.6 kg)  04/07/18 148 lb (67.1 kg)  02/20/18 151 lb 8 oz (68.7 kg)  Body mass index is 24.79 kg/m.    Vit D: Due for re-eval.   Mild intermittant asthma: no issues   She has been having some upset stomach, cramping diarrhea... No emesis or fever.  Started after eating cheese and champagne last night.  Social History /Family History/Past Medical History reviewed in detail and updated in EMR if needed. Blood pressure 98/70, pulse 63, temperature 98 F (36.7 C), temperature source Oral, height 5\' 5"  (1.651 m), weight 149 lb (67.6 kg), last menstrual period 09/04/2014, SpO2 98 %.  Review of Systems  Constitutional: Negative for fatigue and fever.  HENT: Negative for congestion.   Eyes: Negative for pain.  Respiratory: Negative for cough and shortness of breath.   Cardiovascular: Negative for chest pain, palpitations and leg swelling.  Gastrointestinal: Negative for abdominal pain.  Genitourinary: Negative for dysuria and vaginal bleeding.  Musculoskeletal: Negative for back pain.  Neurological: Negative for syncope, light-headedness and headaches.  Psychiatric/Behavioral: Negative for dysphoric mood.       Objective:   Physical Exam  Constitutional: Vital signs are normal. She appears well-developed and well-nourished. She is cooperative.  Non-toxic appearance. She does not appear  ill. No distress.  HENT:  Head: Normocephalic.  Right Ear: Hearing, tympanic membrane, external ear and ear canal normal.  Left Ear: Hearing, tympanic membrane, external ear and ear canal normal.  Nose: Nose normal.  Eyes: Pupils are equal, round, and reactive to light. Conjunctivae, EOM and lids are normal. Lids are everted and swept, no foreign bodies found.  Neck: Trachea normal and normal range of motion. Neck supple. Carotid bruit is not present. No thyroid mass and no thyromegaly present.  Cardiovascular: Normal rate, regular rhythm, S1 normal, S2 normal, normal heart sounds and intact distal pulses. Exam reveals no gallop.  No murmur heard. Pulmonary/Chest: Effort normal and breath sounds normal. No respiratory distress. She has no wheezes. She has no rhonchi. She has no rales.  Abdominal: Soft. Normal appearance and bowel sounds are normal. She exhibits no distension, no fluid wave, no abdominal bruit and no mass. There is no hepatosplenomegaly. There is no tenderness. There is no rebound, no guarding and no CVA tenderness. No hernia.  Lymphadenopathy:    She has no cervical adenopathy.    She has no axillary adenopathy.  Neurological: She is alert. She has normal strength. No cranial nerve deficit or sensory deficit.  Skin: Skin is warm, dry and intact. No rash noted.  Psychiatric: Her speech is normal and behavior is normal. Judgment normal. Her mood appears not anxious. Cognition and memory are normal. She does not exhibit a depressed mood.          Assessment & Plan:  The patient's preventative maintenance and recommended screening tests  for an annual wellness exam were reviewed in full today. Brought up to date unless services declined.  Counselled on the importance of diet, exercise, and its role in overall health and mortality. The patient's FH and SH was reviewed, including their home life, tobacco status, and drug and alcohol status.   Former smoker GYN: Dr. Ulanda Edison  2018 pap 2018..has appt 05/2018 Mammogram: At GYN office. Vaccine: Uptodate with Tdap. Colon: 11/2012, Dr. Fuller Plan polyps, repeat in 5 years.  Hep C: negative

## 2018-04-25 NOTE — Assessment & Plan Note (Signed)
Due for re-eval. 

## 2018-05-07 ENCOUNTER — Encounter: Payer: Self-pay | Admitting: Gastroenterology

## 2018-05-07 ENCOUNTER — Other Ambulatory Visit (INDEPENDENT_AMBULATORY_CARE_PROVIDER_SITE_OTHER): Payer: Managed Care, Other (non HMO)

## 2018-05-07 DIAGNOSIS — E782 Mixed hyperlipidemia: Secondary | ICD-10-CM | POA: Diagnosis not present

## 2018-05-07 DIAGNOSIS — Z0000 Encounter for general adult medical examination without abnormal findings: Secondary | ICD-10-CM

## 2018-05-07 DIAGNOSIS — E559 Vitamin D deficiency, unspecified: Secondary | ICD-10-CM | POA: Diagnosis not present

## 2018-05-07 DIAGNOSIS — Z130 Encounter for screening for diseases of the blood and blood-forming organs and certain disorders involving the immune mechanism: Secondary | ICD-10-CM | POA: Diagnosis not present

## 2018-05-07 DIAGNOSIS — R7303 Prediabetes: Secondary | ICD-10-CM | POA: Diagnosis not present

## 2018-05-07 LAB — CBC WITH DIFFERENTIAL/PLATELET
BASOS PCT: 0.5 % (ref 0.0–3.0)
Basophils Absolute: 0 10*3/uL (ref 0.0–0.1)
EOS ABS: 0 10*3/uL (ref 0.0–0.7)
EOS PCT: 0.8 % (ref 0.0–5.0)
HCT: 36.2 % (ref 36.0–46.0)
HEMOGLOBIN: 12.4 g/dL (ref 12.0–15.0)
LYMPHS ABS: 2.2 10*3/uL (ref 0.7–4.0)
Lymphocytes Relative: 50.6 % — ABNORMAL HIGH (ref 12.0–46.0)
MCHC: 34.3 g/dL (ref 30.0–36.0)
MCV: 88 fl (ref 78.0–100.0)
Monocytes Absolute: 0.3 10*3/uL (ref 0.1–1.0)
Monocytes Relative: 6.5 % (ref 3.0–12.0)
NEUTROS ABS: 1.8 10*3/uL (ref 1.4–7.7)
NEUTROS PCT: 41.6 % — AB (ref 43.0–77.0)
PLATELETS: 260 10*3/uL (ref 150.0–400.0)
RBC: 4.11 Mil/uL (ref 3.87–5.11)
RDW: 13.8 % (ref 11.5–15.5)
WBC: 4.4 10*3/uL (ref 4.0–10.5)

## 2018-05-07 LAB — COMPREHENSIVE METABOLIC PANEL
ALBUMIN: 4.5 g/dL (ref 3.5–5.2)
ALT: 10 U/L (ref 0–35)
AST: 16 U/L (ref 0–37)
Alkaline Phosphatase: 83 U/L (ref 39–117)
BUN: 18 mg/dL (ref 6–23)
CHLORIDE: 103 meq/L (ref 96–112)
CO2: 29 meq/L (ref 19–32)
Calcium: 9.8 mg/dL (ref 8.4–10.5)
Creatinine, Ser: 0.85 mg/dL (ref 0.40–1.20)
GFR: 88.8 mL/min (ref >60.00)
Glucose, Bld: 98 mg/dL (ref 70–99)
POTASSIUM: 3.9 meq/L (ref 3.5–5.1)
SODIUM: 138 meq/L (ref 135–145)
Total Bilirubin: 0.6 mg/dL (ref 0.2–1.2)
Total Protein: 7.7 g/dL (ref 6.0–8.3)

## 2018-05-07 LAB — LIPID PANEL
CHOL/HDL RATIO: 4
Cholesterol: 211 mg/dL — ABNORMAL HIGH (ref 0–200)
HDL: 52.5 mg/dL (ref >39.00)
LDL CALC: 138 mg/dL — AB (ref 0–99)
NONHDL: 158.94
TRIGLYCERIDES: 105 mg/dL (ref 0.0–149.0)
VLDL: 21 mg/dL (ref 0.0–40.0)

## 2018-05-07 LAB — HEMOGLOBIN A1C: Hgb A1c MFr Bld: 5.9 % (ref 4.6–6.5)

## 2018-05-07 LAB — VITAMIN D 25 HYDROXY (VIT D DEFICIENCY, FRACTURES): VITD: 23.59 ng/mL — AB (ref 30.00–100.00)

## 2018-05-08 ENCOUNTER — Encounter: Payer: Self-pay | Admitting: Gastroenterology

## 2018-05-23 ENCOUNTER — Encounter: Payer: Managed Care, Other (non HMO) | Admitting: Family Medicine

## 2018-06-02 ENCOUNTER — Telehealth: Payer: Self-pay | Admitting: Family Medicine

## 2018-06-02 LAB — HM MAMMOGRAPHY

## 2018-06-02 NOTE — Telephone Encounter (Signed)
Labs have not been annotated as they were to be discussed at appt. Labs done after OV already complete

## 2018-06-02 NOTE — Telephone Encounter (Signed)
Copied from [place]. Topic: Quick Communication - See Telephone Encounter >> Jun 02, 2018  1:58 PM Mylinda Latina, NT wrote: CRM for notification. See Telephone encounter for: 06/02/18. Patient called and is inquiring about her lab results. Please call [phone]

## 2018-06-03 NOTE — Telephone Encounter (Signed)
[name] [name] notified as instructed by telephone.  Copy of lab results mailed to patient as well.

## 2018-06-03 NOTE — Telephone Encounter (Signed)
Please apologize to pt..  I mistook her future appt for an upcoming one.  let her know of results of labs.. Cholesterol improved from last check,  Sugar in nml range, A1C suggest occ slight elevation of sugar..conitnue healthy eating anregualr exercise.   Vit D is low.. If not on supplement .Marland Kitchen Start vit D 400 IU twice daily OTC.  CMET otherwise nml and no anemia.

## 2018-06-27 ENCOUNTER — Other Ambulatory Visit: Payer: Self-pay

## 2018-06-27 ENCOUNTER — Ambulatory Visit (AMBULATORY_SURGERY_CENTER): Payer: Self-pay

## 2018-06-27 ENCOUNTER — Encounter: Payer: Self-pay | Admitting: Gastroenterology

## 2018-06-27 VITALS — Ht 65.0 in | Wt 151.2 lb

## 2018-06-27 DIAGNOSIS — Z860100 Personal history of colon polyps, unspecified: Secondary | ICD-10-CM

## 2018-06-27 MED ORDER — NA SULFATE-K SULFATE-MG SULF 17.5-3.13-1.6 GM/177ML PO SOLN: 1.0000 | Freq: Once | ORAL | 0 refills | Status: AC

## 2018-06-27 NOTE — Progress Notes (Signed)
No egg or soy allergy known to patient  No issues with past sedation with any surgeries  or procedures, no intubation problems  No diet pills per patient No home 02 use per patient  No blood thinners per patient  Pt denies issues with constipation  No A fib or A flutter  EMMI video sent to pt's e mail , pt declined    

## 2018-07-07 ENCOUNTER — Encounter: Payer: Managed Care, Other (non HMO) | Admitting: Gastroenterology

## 2018-07-11 ENCOUNTER — Ambulatory Visit: Payer: Self-pay

## 2018-07-11 NOTE — Telephone Encounter (Signed)
Spoke with [name] [name].  Was she having these issues prior to patch?  No  How long has she been off? 2 weeks

## 2018-07-11 NOTE — Telephone Encounter (Signed)
Pt. States her GYN doctor put her on HRT Patch. She felt good for 14 days and then started her period. Had a period for 2 weeks. She took the patch off and called her GYN. He reported "that was fine." She states she is having moodiness, inattention. Feels like she can't enjoy things she used to enjoy. It takes effort to get up and get going. Wants to know Dr. Diona Browner suggest she do now. Does not want to go back to her GYN. Please advise pt.  Answer Assessment - Initial Assessment Questions 1. SYMPTOMS: "Do you have any symptoms?"     Moody, inattention,can't focus,no desire to do things she usually enjoys 2. SEVERITY: If symptoms are present, ask "Are they mild, moderate or severe?"     Severe  Protocols used: MEDICATION QUESTION CALL-A-AH

## 2018-07-11 NOTE — Telephone Encounter (Signed)
I would give it more time to get out of her system.. Drink lots of water, get adequate sleep at night, healthy lifestyle habits.

## 2018-07-11 NOTE — Telephone Encounter (Signed)
Was she having these issues prior to patch?  How long has she been off?

## 2018-07-11 NOTE — Telephone Encounter (Signed)
Ms. Hepler notified as instructed by telephone.  Patient states understanding.

## 2018-07-11 NOTE — Telephone Encounter (Signed)
Pt last seen 04/25/18 annual exam.

## 2018-09-16 ENCOUNTER — Encounter: Payer: Self-pay | Admitting: Gastroenterology

## 2018-09-16 ENCOUNTER — Ambulatory Visit (AMBULATORY_SURGERY_CENTER): Payer: Managed Care, Other (non HMO) | Admitting: Gastroenterology

## 2018-09-16 VITALS — BP 120/67 | HR 55 | Temp 98.4°F | Resp 13 | Ht 65.0 in | Wt 149.0 lb

## 2018-09-16 DIAGNOSIS — Z86010 Personal history of colonic polyps: Secondary | ICD-10-CM | POA: Diagnosis present

## 2018-09-16 MED ORDER — SODIUM CHLORIDE 0.9 % IV SOLN: 500.0000 mL | Freq: Once | INTRAVENOUS | Status: DC

## 2018-09-16 NOTE — Progress Notes (Signed)
Pt's states no medical or surgical changes since previsit or office visit. 

## 2018-09-16 NOTE — Op Note (Signed)
[facility] Patient Name: [name] [name] Procedure Date: 09/16/2018 12:56 PM MRN: [id number] Endoscopist: Ladene Artist , MD Age: 57 Referring MD:  Date of Birth: 1961-04-27 Gender: Female Account #: [id number] Procedure:                Colonoscopy Indications:              Surveillance: Personal history of adenomatous                            polyps on last colonoscopy 5 years ago Medicines:                Monitored Anesthesia Care Procedure:                Pre-Anesthesia Assessment:                           - Prior to the procedure, a History and Physical                            was performed, and patient medications and                            allergies were reviewed. The patient's tolerance of                            previous anesthesia was also reviewed. The risks                            and benefits of the procedure and the sedation                            options and risks were discussed with the patient.                            All questions were answered, and informed consent                            was obtained. Prior Anticoagulants: The patient has                            taken no previous anticoagulant or antiplatelet                            agents. ASA Grade Assessment: II - A patient with                            mild systemic disease. After reviewing the risks                            and benefits, the patient was deemed in                            satisfactory condition to undergo the procedure.  After obtaining informed consent, the colonoscope                            was passed under direct vision. Throughout the                            procedure, the patient's blood pressure, pulse, and                            oxygen saturations were monitored continuously. The                            Colonoscope was introduced through the anus and                            advanced to the the  cecum, identified by                            appendiceal orifice and ileocecal valve. The                            ileocecal valve, appendiceal orifice, and rectum                            were photographed. The quality of the bowel                            preparation was excellent. The colonoscopy was                            performed without difficulty. The patient tolerated                            the procedure well. Scope In: 1:04:31 PM Scope Out: 1:20:29 PM Scope Withdrawal Time: 0 hours 12 minutes 26 seconds  Total Procedure Duration: 0 hours 15 minutes 58 seconds  Findings:                 The perianal and digital rectal examinations were                            normal.                           Internal hemorrhoids were found during                            retroflexion. The hemorrhoids were small and Grade                            I (internal hemorrhoids that do not prolapse).                           The exam was otherwise without abnormality on  direct and retroflexion views. Complications:            No immediate complications. Estimated blood loss:                            None. Estimated Blood Loss:     Estimated blood loss: none. Impression:               - Internal hemorrhoids.                           - The examination was otherwise normal on direct                            and retroflexion views.                           - No specimens collected. Recommendation:           - Repeat colonoscopy in 5 years for surveillance.                           - Patient has a contact number available for                            emergencies. The signs and symptoms of potential                            delayed complications were discussed with the                            patient. Return to normal activities tomorrow.                            Written discharge instructions were provided to the                             patient.                           - Resume previous diet.                           - Continue present medications. Ladene Artist, MD 09/16/2018 1:24:11 PM This report has been signed electronically.

## 2018-09-16 NOTE — Progress Notes (Signed)
Spontaneous respirations throughout. VSS. Resting comfortably. To PACU on room air. Report to  RN. 

## 2018-09-16 NOTE — Patient Instructions (Signed)
Hand out on hemorrhoids given  YOU HAD AN ENDOSCOPIC PROCEDURE TODAY AT THE [facility]:   Refer to the procedure report that was given to you for any specific questions about what was found during the examination.  If the procedure report does not answer your questions, please call your gastroenterologist to clarify.  If you requested that your care partner not be given the details of your procedure findings, then the procedure report has been included in a sealed envelope for you to review at your convenience later.  YOU SHOULD EXPECT: Some feelings of bloating in the abdomen. Passage of more gas than usual.  Walking can help get rid of the air that was put into your GI tract during the procedure and reduce the bloating. If you had a lower endoscopy (such as a colonoscopy or flexible sigmoidoscopy) you may notice spotting of blood in your stool or on the toilet paper. If you underwent a bowel prep for your procedure, you may not have a normal bowel movement for a few days.  Please Note:  You might notice some irritation and congestion in your nose or some drainage.  This is from the oxygen used during your procedure.  There is no need for concern and it should clear up in a day or so.  SYMPTOMS TO REPORT IMMEDIATELY:   Following lower endoscopy (colonoscopy or flexible sigmoidoscopy):  Excessive amounts of blood in the stool  Significant tenderness or worsening of abdominal pains  Swelling of the abdomen that is new, acute  Fever of 100F or higher   For urgent or emergent issues, a gastroenterologist can be reached at any hour by calling [phone].   DIET:  We do recommend a small meal at first, but then you may proceed to your regular diet.  Drink plenty of fluids but you should avoid alcoholic beverages for 24 hours.  ACTIVITY:  You should plan to take it easy for the rest of today and you should NOT DRIVE or use heavy machinery until tomorrow (because of the sedation  medicines used during the test).    FOLLOW UP: Our staff will call the number listed on your records the next business day following your procedure to check on you and address any questions or concerns that you may have regarding the information given to you following your procedure. If we do not reach you, we will leave a message.  However, if you are feeling well and you are not experiencing any problems, there is no need to return our call.  We will assume that you have returned to your regular daily activities without incident.  If any biopsies were taken you will be contacted by phone or by letter within the next 1-3 weeks.  Please call us at [phone] if you have not heard about the biopsies in 3 weeks.    SIGNATURES/CONFIDENTIALITY: You and/or your care partner have signed paperwork which will be entered into your electronic medical record.  These signatures attest to the fact that that the information above on your After Visit Summary has been reviewed and is understood.  Full responsibility of the confidentiality of this discharge information lies with you and/or your care-partner. 

## 2018-09-17 ENCOUNTER — Telehealth: Payer: Self-pay

## 2018-09-17 NOTE — Telephone Encounter (Signed)
  Follow up Call-  Call back number 09/16/2018  Post procedure Call Back phone  # [phone]  Permission to leave phone message Yes  Some recent data might be hidden     No ID on voicemail; no message left

## 2018-09-17 NOTE — Telephone Encounter (Signed)
Attempted to reach patient for post-procedure f/u call. No answer. Left message that we will make another attempt to reach her again later today and to please not hesitate to call us if she has any questions/concerns regarding her care.

## 2019-03-17 ENCOUNTER — Telehealth: Payer: Self-pay | Admitting: Family Medicine

## 2019-03-17 MED ORDER — VITAMIN D3 1.25 MG (50000 UT) PO CAPS: 1.0000 | ORAL_CAPSULE | ORAL | 0 refills | Status: DC

## 2019-03-17 NOTE — Telephone Encounter (Signed)
Pt called to request vitamin D prescription sent [facility], [place]. She said she is tired and sleepy all the time, throughout the day- she thinks it's vit D. CPE scheduled 7/31.

## 2019-04-30 ENCOUNTER — Telehealth: Payer: Self-pay | Admitting: Radiology

## 2019-04-30 NOTE — Telephone Encounter (Signed)
Needs back lab and covid info

## 2019-05-01 ENCOUNTER — Telehealth: Payer: Self-pay | Admitting: Family Medicine

## 2019-05-01 DIAGNOSIS — E782 Mixed hyperlipidemia: Secondary | ICD-10-CM

## 2019-05-01 DIAGNOSIS — Z131 Encounter for screening for diabetes mellitus: Secondary | ICD-10-CM

## 2019-05-01 DIAGNOSIS — E559 Vitamin D deficiency, unspecified: Secondary | ICD-10-CM

## 2019-05-01 DIAGNOSIS — Z130 Encounter for screening for diseases of the blood and blood-forming organs and certain disorders involving the immune mechanism: Secondary | ICD-10-CM

## 2019-05-01 NOTE — Addendum Note (Signed)
Addended by: Ellamae Sia on: 05/01/2019 03:19 PM   Modules accepted: Orders

## 2019-05-01 NOTE — Telephone Encounter (Signed)
-----   Message from Ellamae Sia sent at 04/28/2019 10:24 AM EDT ----- Regarding: Lab orders for Monday, 7.27.20 Patient is scheduled for CPX labs, please order future labs, Thanks , Karna Christmas

## 2019-05-04 ENCOUNTER — Other Ambulatory Visit (INDEPENDENT_AMBULATORY_CARE_PROVIDER_SITE_OTHER): Payer: Managed Care, Other (non HMO)

## 2019-05-04 ENCOUNTER — Other Ambulatory Visit: Payer: Self-pay

## 2019-05-04 DIAGNOSIS — Z131 Encounter for screening for diabetes mellitus: Secondary | ICD-10-CM | POA: Diagnosis not present

## 2019-05-04 DIAGNOSIS — E782 Mixed hyperlipidemia: Secondary | ICD-10-CM | POA: Diagnosis not present

## 2019-05-04 DIAGNOSIS — Z130 Encounter for screening for diseases of the blood and blood-forming organs and certain disorders involving the immune mechanism: Secondary | ICD-10-CM

## 2019-05-04 DIAGNOSIS — E559 Vitamin D deficiency, unspecified: Secondary | ICD-10-CM

## 2019-05-04 LAB — CBC WITH DIFFERENTIAL/PLATELET
Basophils Absolute: 0 10*3/uL (ref 0.0–0.1)
Basophils Relative: 1 % (ref 0.0–3.0)
Eosinophils Absolute: 0 10*3/uL (ref 0.0–0.7)
Eosinophils Relative: 1 % (ref 0.0–5.0)
HCT: 35.3 % — ABNORMAL LOW (ref 36.0–46.0)
Hemoglobin: 11.8 g/dL — ABNORMAL LOW (ref 12.0–15.0)
Lymphocytes Relative: 56.5 % — ABNORMAL HIGH (ref 12.0–46.0)
Lymphs Abs: 2.4 10*3/uL (ref 0.7–4.0)
MCHC: 33.5 g/dL (ref 30.0–36.0)
MCV: 89.7 fl (ref 78.0–100.0)
Monocytes Absolute: 0.3 10*3/uL (ref 0.1–1.0)
Monocytes Relative: 6.7 % (ref 3.0–12.0)
Neutro Abs: 1.5 10*3/uL (ref 1.4–7.7)
Neutrophils Relative %: 34.8 % — ABNORMAL LOW (ref 43.0–77.0)
Platelets: 244 10*3/uL (ref 150.0–400.0)
RBC: 3.93 Mil/uL (ref 3.87–5.11)
RDW: 13.3 % (ref 11.5–15.5)
WBC: 4.3 10*3/uL (ref 4.0–10.5)

## 2019-05-04 LAB — COMPREHENSIVE METABOLIC PANEL
ALT: 13 U/L (ref 0–35)
AST: 18 U/L (ref 0–37)
Albumin: 4.4 g/dL (ref 3.5–5.2)
Alkaline Phosphatase: 77 U/L (ref 39–117)
BUN: 15 mg/dL (ref 6–23)
CO2: 28 mEq/L (ref 19–32)
Calcium: 9.9 mg/dL (ref 8.4–10.5)
Chloride: 105 mEq/L (ref 96–112)
Creatinine, Ser: 0.96 mg/dL (ref 0.40–1.20)
GFR: 72.35 mL/min (ref >60.00)
Glucose, Bld: 100 mg/dL — ABNORMAL HIGH (ref 70–99)
Potassium: 4.2 mEq/L (ref 3.5–5.1)
Sodium: 140 mEq/L (ref 135–145)
Total Bilirubin: 0.5 mg/dL (ref 0.2–1.2)
Total Protein: 7 g/dL (ref 6.0–8.3)

## 2019-05-04 LAB — LIPID PANEL
Cholesterol: 226 mg/dL — ABNORMAL HIGH (ref 0–200)
HDL: 49.6 mg/dL (ref >39.00)
LDL Cholesterol: 144 mg/dL — ABNORMAL HIGH (ref 0–99)
NonHDL: 176.71
Total CHOL/HDL Ratio: 5
Triglycerides: 165 mg/dL — ABNORMAL HIGH (ref 0.0–149.0)
VLDL: 33 mg/dL (ref 0.0–40.0)

## 2019-05-04 LAB — VITAMIN D 25 HYDROXY (VIT D DEFICIENCY, FRACTURES): VITD: 41.64 ng/mL (ref 30.00–100.00)

## 2019-05-04 LAB — HEMOGLOBIN A1C: Hgb A1c MFr Bld: 5.9 % (ref 4.6–6.5)

## 2019-05-08 ENCOUNTER — Other Ambulatory Visit: Payer: Self-pay

## 2019-05-08 ENCOUNTER — Ambulatory Visit (INDEPENDENT_AMBULATORY_CARE_PROVIDER_SITE_OTHER): Payer: Managed Care, Other (non HMO) | Admitting: Family Medicine

## 2019-05-08 ENCOUNTER — Encounter: Payer: Self-pay | Admitting: Family Medicine

## 2019-05-08 VITALS — BP 120/72 | HR 52 | Temp 98.3°F | Ht 65.0 in | Wt 153.5 lb

## 2019-05-08 DIAGNOSIS — Z0000 Encounter for general adult medical examination without abnormal findings: Secondary | ICD-10-CM

## 2019-05-08 DIAGNOSIS — E559 Vitamin D deficiency, unspecified: Secondary | ICD-10-CM

## 2019-05-08 DIAGNOSIS — J4520 Mild intermittent asthma, uncomplicated: Secondary | ICD-10-CM

## 2019-05-08 DIAGNOSIS — E782 Mixed hyperlipidemia: Secondary | ICD-10-CM | POA: Diagnosis not present

## 2019-05-08 DIAGNOSIS — R7303 Prediabetes: Secondary | ICD-10-CM

## 2019-05-08 NOTE — Progress Notes (Signed)
Chief Complaint  Patient presents with  . Annual Exam    History of Present Illness: HPI The patient is here for annual wellness exam and preventative care.    Elevated Cholesterol:  Lab Results  Component Value Date   CHOL 226 (H) 05/04/2019   HDL 49.60 05/04/2019   LDLCALC 144 (H) 05/04/2019   LDLDIRECT 126.6 12/22/2012   TRIG 165.0 (H) 05/04/2019   CHOLHDL 5 05/04/2019   Using medications without problems: Muscle aches:  Diet compliance: moderate, salads Exercise:not in last 4 weeks, was walking daily Other complaints: Wt Readings from Last 3 Encounters:  05/08/19 153 lb 8 oz (69.6 kg)  09/16/18 149 lb (67.6 kg)  06/27/18 151 lb 3.2 oz (68.6 kg)  Body mass index is 25.54 kg/m.    Mild intermittent asthma no issues   Vit D def:  Nml, repleted  Mild anemia hg 11.8  COVID 19 screen No recent travel or known exposure to [place] The patient denies respiratory symptoms of COVID 19 at this time.  The importance of social distancing was discussed today.   Review of Systems  Constitutional: Negative for chills and fever.  HENT: Negative for congestion and ear pain.   Eyes: Negative for pain and redness.  Respiratory: Negative for cough and shortness of breath.   Cardiovascular: Negative for chest pain, palpitations and leg swelling.  Gastrointestinal: Negative for abdominal pain, blood in stool, constipation, diarrhea, nausea and vomiting.  Genitourinary: Negative for dysuria.  Musculoskeletal: Negative for falls and myalgias.  Skin: Negative for rash.  Neurological: Negative for dizziness.  Psychiatric/Behavioral: Negative for depression. The patient is not nervous/anxious.       Past Medical History:  Diagnosis Date  . Glaucoma     reports that she quit smoking about 7 years ago. Her smoking use included cigarettes. She has a 5.00 pack-year smoking history. She has never used smokeless tobacco. She reports current alcohol use. She reports that she does not  use drugs.   Current Outpatient Medications:  .  Cholecalciferol (VITAMIN D3) 1.25 MG (50000 UT) CAPS, Take 1 capsule by mouth once a week., Disp: 12 capsule, Rfl: 0 .  timolol (TIMOPTIC) 0.5 % ophthalmic solution, , Disp: , Rfl:    Observations/Objective: Blood pressure 120/72, pulse (!) 52, temperature 98.3 F (36.8 C), temperature source Temporal, height 5\' 5"  (1.651 m), weight 153 lb 8 oz (69.6 kg), last menstrual period 09/04/2014, SpO2 98 %.  Physical Exam Constitutional:      General: She is not in acute distress.    Appearance: Normal appearance. She is well-developed. She is not ill-appearing or toxic-appearing.  HENT:     Head: Normocephalic.     Right Ear: Hearing, tympanic membrane, ear canal and external ear normal.     Left Ear: Hearing, tympanic membrane, ear canal and external ear normal.     Nose: Nose normal.  Eyes:     General: Lids are normal. Lids are everted, no foreign bodies appreciated.     Conjunctiva/sclera: Conjunctivae normal.     Pupils: Pupils are equal, round, and reactive to light.  Neck:     Musculoskeletal: Normal range of motion and neck supple.     Thyroid: No thyroid mass or thyromegaly.     Vascular: No carotid bruit.     Trachea: Trachea normal.  Cardiovascular:     Rate and Rhythm: Normal rate and regular rhythm.     Heart sounds: Normal heart sounds, S1 normal and S2 normal. No murmur. No  gallop.   Pulmonary:     Effort: Pulmonary effort is normal. No respiratory distress.     Breath sounds: Normal breath sounds. No wheezing, rhonchi or rales.  Abdominal:     General: Bowel sounds are normal. There is no distension or abdominal bruit.     Palpations: Abdomen is soft. There is no fluid wave or mass.     Tenderness: There is no abdominal tenderness. There is no guarding or rebound.     Hernia: No hernia is present.  Lymphadenopathy:     Cervical: No cervical adenopathy.  Skin:    General: Skin is warm and dry.     Findings: No rash.   Neurological:     Mental Status: She is alert.     Cranial Nerves: No cranial nerve deficit.     Sensory: No sensory deficit.  Psychiatric:        Mood and Affect: Mood is not anxious or depressed.        Speech: Speech normal.        Behavior: Behavior normal. Behavior is cooperative.        Judgment: Judgment normal.      Assessment and Plan   The patient's preventative maintenance and recommended screening tests for an annual wellness exam were reviewed in full today. Brought up to date unless services declined.  Counselled on the importance of diet, exercise, and its role in overall health and mortality. The patient's FH and SH was reviewed, including their home life, tobacco status, and drug and alcohol status.   Former smoker GYN: Dr. Ulanda Edison 2018 pap 2018. Get records Mammogram: At GYN office... get records Vaccine: Uptodate with Tdap. Colon: 09/2018 nml, int hemorrhoids, Dr. Fuller Plan,  repeat in 5 years. Hep C: negative HIV declined  Hyperlipidemia 3.9% risk of CVD in next 10 years per AHA risk calculator Encouraged exercise, weight loss, healthy eating habits.   Vitamin D deficiency Repleted    Eliezer Lofts, MD

## 2019-05-08 NOTE — Assessment & Plan Note (Signed)
3.9% risk of CVD in next 10 years per AHA risk calculator Encouraged exercise, weight loss, healthy eating habits.

## 2019-05-08 NOTE — Assessment & Plan Note (Signed)
Repleted. °

## 2019-05-08 NOTE — Patient Instructions (Signed)
Preventing High Cholesterol °Cholesterol is a white, waxy substance similar to fat that the human body needs to help build cells. The liver makes all the cholesterol that a person's body needs. Having high cholesterol (hypercholesterolemia) increases a person's risk for heart disease and stroke. Extra (excess) cholesterol comes from the food the person eats. °High cholesterol can often be prevented with diet and lifestyle changes. If you already have high cholesterol, you can control it with diet and lifestyle changes and with medicine. °How can high cholesterol affect me? °If you have high cholesterol, deposits (plaques) may build up on the walls of your arteries. The arteries are the blood vessels that carry blood away from your heart. °Plaques make the arteries narrower and stiffer. This can limit or block blood flow and cause blood clots to form. Blood clots: °· Are tiny balls of cells that form in your blood. °· Can move to the heart or brain, causing a heart attack or stroke. °Plaques in arteries greatly increase your risk for heart attack and stroke.Making diet and lifestyle changes can reduce your risk for these conditions that may threaten your life. °What can increase my risk? °This condition is more likely to develop in people who: °· Eat foods that are high in saturated fat or cholesterol. Saturated fat is mostly found in: °? Foods that contain animal fat, such as red meat and some dairy products. °? Certain fatty foods made from plants, such as tropical oils. °· Are overweight. °· Are not getting enough exercise. °· Have a family history of high cholesterol. °What actions can I take to prevent this? °Nutrition ° °· Eat less saturated fat. °· Avoid trans fats (partially hydrogenated oils). These are often found in margarine and in some baked goods, fried foods, and snacks bought in packages. °· Avoid precooked or cured meat, such as sausages or meat loaves. °· Avoid foods and drinks that have added  sugars. °· Eat more fruits, vegetables, and whole grains. °· Choose healthy sources of protein, such as fish, poultry, lean cuts of red meat, beans, peas, lentils, and nuts. °· Choose healthy sources of fat, such as: °? Nuts. °? Vegetable oils, especially olive oil. °? Fish that have healthy fats (omega-3 fatty acids), such as mackerel or salmon. °The items listed above may not be a complete list of recommended foods and beverages. Contact a dietitian for more information. °Lifestyle °· Lose weight if you are overweight. Losing 5-10 lb (2.3-4.5 kg) can help prevent or control high cholesterol. It can also lower your risk for diabetes and high blood pressure. Ask your health care provider to help you with a diet and exercise plan to lose weight safely. °· Do not use any products that contain nicotine or tobacco, such as cigarettes, e-cigarettes, and chewing tobacco. If you need help quitting, ask your health care provider. °· Limit your alcohol intake. °? Do not drink alcohol if: °§ Your health care provider tells you not to drink. °§ You are pregnant, may be pregnant, or are planning to become pregnant. °? If you drink alcohol: °§ Limit how much you use to: °§ 0-1 drink a day for women. °§ 0-2 drinks a day for men. °§ Be aware of how much alcohol is in your drink. In the [place] one drink equals one 12 oz bottle of beer (355 mL), one 5 oz glass of wine (148 mL), or one 1½ oz glass of hard liquor (44 mL). °Activity ° °· Get enough exercise. Each week, do at   least 150 minutes of exercise that takes a medium level of effort (moderate-intensity exercise). °? This is exercise that: °§ Makes your heart beat faster and makes you breathe harder than usual. °§ Allows you to still be able to talk. °? You could exercise in short sessions several times a day or longer sessions a few times a week. For example, on 5 days each week, you could walk fast or ride your bike 3 times a day for 10 minutes each time. °· Do exercises as told  by your health care provider. °Medicines °· In addition to diet and lifestyle changes, your health care provider may recommend medicines to help lower cholesterol. This may be a medicine to lower the amount of cholesterol your liver makes. You may need medicine if: °? Diet and lifestyle changes do not lower your cholesterol enough. °? You have high cholesterol and other risk factors for heart disease or stroke. °· Take over-the-counter and prescription medicines only as told by your health care provider. °General information °· Manage your risk factors for high cholesterol. Talk with your health care provider about all your risk factors and how to lower your risk. °· Manage other conditions that you have, such as diabetes or high blood pressure (hypertension). °· Have blood tests to check your cholesterol levels at regular points in time as told by your health care provider. °· Keep all follow-up visits as told by your health care provider. This is important. °Where to find more information °· American Heart Association: www.heart.org °· National Heart, Lung, and Blood Institute: www.nhlbi.nih.gov °Summary °· High cholesterol increases your risk for heart disease and stroke. By keeping your cholesterol level low, you can reduce your risk for these conditions. °· High cholesterol can often be prevented with diet and lifestyle changes. °· Work with your health care provider to manage your risk factors, and have your blood tested regularly. °This information is not intended to replace advice given to you by your health care provider. Make sure you discuss any questions you have with your health care provider. °Document Released: 10/09/2015 Document Revised: 01/16/2019 Document Reviewed: 06/02/2016 °Elsevier Patient Education © 2020 [employer] ° °

## 2019-05-11 ENCOUNTER — Encounter: Payer: Self-pay | Admitting: Family Medicine

## 2019-06-04 ENCOUNTER — Other Ambulatory Visit: Payer: Self-pay | Admitting: *Deleted

## 2019-06-04 NOTE — Telephone Encounter (Signed)
Last office visit 05/08/2019 for CPE.  Last refilled 03/17/2019 for #12 with no refills.  Last Vit D level was normal at 41.64 ng/ml on 05/04/2019.  Refill?

## 2019-06-05 MED ORDER — VITAMIN D3 1.25 MG (50000 UT) PO CAPS: 1.0000 | ORAL_CAPSULE | ORAL | 0 refills | Status: DC

## 2019-09-07 ENCOUNTER — Ambulatory Visit (INDEPENDENT_AMBULATORY_CARE_PROVIDER_SITE_OTHER): Payer: Managed Care, Other (non HMO) | Admitting: Family Medicine

## 2019-09-07 ENCOUNTER — Encounter: Payer: Self-pay | Admitting: Family Medicine

## 2019-09-07 ENCOUNTER — Other Ambulatory Visit: Payer: Self-pay

## 2019-09-07 VITALS — BP 110/76 | HR 50 | Temp 98.3°F | Ht 65.0 in | Wt 155.2 lb

## 2019-09-07 DIAGNOSIS — R350 Frequency of micturition: Secondary | ICD-10-CM

## 2019-09-07 DIAGNOSIS — R42 Dizziness and giddiness: Secondary | ICD-10-CM | POA: Diagnosis not present

## 2019-09-07 DIAGNOSIS — R531 Weakness: Secondary | ICD-10-CM

## 2019-09-07 LAB — HEPATIC FUNCTION PANEL
ALT: 16 U/L (ref 0–35)
AST: 19 U/L (ref 0–37)
Albumin: 4.4 g/dL (ref 3.5–5.2)
Alkaline Phosphatase: 80 U/L (ref 39–117)
Bilirubin, Direct: 0.1 mg/dL (ref 0.0–0.3)
Total Bilirubin: 0.6 mg/dL (ref 0.2–1.2)
Total Protein: 7.5 g/dL (ref 6.0–8.3)

## 2019-09-07 LAB — BASIC METABOLIC PANEL
BUN: 16 mg/dL (ref 6–23)
CO2: 28 mEq/L (ref 19–32)
Calcium: 9.9 mg/dL (ref 8.4–10.5)
Chloride: 103 mEq/L (ref 96–112)
Creatinine, Ser: 0.82 mg/dL (ref 0.40–1.20)
GFR: 86.68 mL/min (ref >60.00)
Glucose, Bld: 96 mg/dL (ref 70–99)
Potassium: 4.1 mEq/L (ref 3.5–5.1)
Sodium: 137 mEq/L (ref 135–145)

## 2019-09-07 LAB — IBC + FERRITIN
Ferritin: 180.2 ng/mL (ref 10.0–291.0)
Iron: 101 ug/dL (ref 42–145)
Saturation Ratios: 31.5 % (ref 20.0–50.0)
Transferrin: 229 mg/dL (ref 212.0–360.0)

## 2019-09-07 LAB — HEMOGLOBIN A1C: Hgb A1c MFr Bld: 5.7 % (ref 4.6–6.5)

## 2019-09-07 LAB — CBC WITH DIFFERENTIAL/PLATELET
Basophils Absolute: 0 10*3/uL (ref 0.0–0.1)
Basophils Relative: 0.5 % (ref 0.0–3.0)
Eosinophils Absolute: 0 10*3/uL (ref 0.0–0.7)
Eosinophils Relative: 0.6 % (ref 0.0–5.0)
HCT: 36.2 % (ref 36.0–46.0)
Hemoglobin: 12.2 g/dL (ref 12.0–15.0)
Lymphocytes Relative: 55.1 % — ABNORMAL HIGH (ref 12.0–46.0)
Lymphs Abs: 2.2 10*3/uL (ref 0.7–4.0)
MCHC: 33.6 g/dL (ref 30.0–36.0)
MCV: 88.8 fl (ref 78.0–100.0)
Monocytes Absolute: 0.3 10*3/uL (ref 0.1–1.0)
Monocytes Relative: 7.4 % (ref 3.0–12.0)
Neutro Abs: 1.4 10*3/uL (ref 1.4–7.7)
Neutrophils Relative %: 36.4 % — ABNORMAL LOW (ref 43.0–77.0)
Platelets: 250 10*3/uL (ref 150.0–400.0)
RBC: 4.07 Mil/uL (ref 3.87–5.11)
RDW: 13.6 % (ref 11.5–15.5)
WBC: 4 10*3/uL (ref 4.0–10.5)

## 2019-09-07 LAB — POC URINALSYSI DIPSTICK (AUTOMATED)
Bilirubin, UA: NEGATIVE
Blood, UA: NEGATIVE
Glucose, UA: NEGATIVE
Ketones, UA: NEGATIVE
Leukocytes, UA: NEGATIVE
Nitrite, UA: NEGATIVE
Protein, UA: NEGATIVE
Spec Grav, UA: 1.025 (ref 1.010–1.025)
Urobilinogen, UA: 0.2 E.U./dL
pH, UA: 6 (ref 5.0–8.0)

## 2019-09-07 LAB — TSH: TSH: 0.96 u[IU]/mL (ref 0.35–4.50)

## 2019-09-07 LAB — VITAMIN B12: Vitamin B-12: 183 pg/mL — ABNORMAL LOW (ref 211–911)

## 2019-09-07 LAB — VITAMIN D 25 HYDROXY (VIT D DEFICIENCY, FRACTURES): VITD: 25.53 ng/mL — ABNORMAL LOW (ref 30.00–100.00)

## 2019-09-07 NOTE — Progress Notes (Signed)
[name] Mcneely T. Carmelita Amparo, MD Primary Care and [place] at [facility] [place], [address] Phone: [phone]  FAX: [phone]  [name] [name] - 58 y.o. female  MRN [id number]  Date of Birth: September 21, 1961  Visit Date: 09/07/2019  PCP: Jinny Sanders, MD  Referred by: Jinny Sanders, MD  Chief Complaint  Patient presents with  . Dizziness  . Urinary Frequency    Getting up 4 times during the night  . Fatigue  . Bloated   Subjective:   [name] [name] is a 58 y.o. very pleasant female patient who presents with the following:  She is a pleasant elderly lady and she presents with dizziness, weakness, fatigue and she also gets up about 4 times a night to urinate.  She occasionally feels woozy with movement and she has some pain also.  She thinks this is gotten worse over the last 7 to 10 days.  She has had this intermittently off and on before this as well.  She has had some low vitamin D levels in the past and thinks it is similar to that.  Wakes up drowy and moves then will feel like she is having some dizziness with movement.  Cramp-like pains.  Then feels woozy - 7-10 days has been like this.   Tingle and white of her fingers.  Some happens for more.   Low vit d or b12 dm  Past Medical History, Surgical History, Social History, Family History, Problem List, Medications, and Allergies have been reviewed and updated if relevant.  Patient Active Problem List   Diagnosis Date Noted  . Sleep apnea 04/25/2018  . Vitamin D deficiency 02/28/2016  . Mild intermittent asthma 11/23/2010  . Hyperlipidemia 10/24/2007    Past Medical History:  Diagnosis Date  . Glaucoma     Past Surgical History:  Procedure Laterality Date  . COLONOSCOPY    . GANGLION CYST EXCISION  11/2005  . POLYPECTOMY      Social History   Socioeconomic History  . Marital status: Single    Spouse name: Not on file  . Number of children: 1   . Years of education: Not on file  . Highest education level: Not on file  Occupational History  . Occupation: Nature conservation officer: UNEMPLOYED  Social Needs  . Financial resource strain: Not on file  . Food insecurity    Worry: Not on file    Inability: Not on file  . Transportation needs    Medical: Not on file    Non-medical: Not on file  Tobacco Use  . Smoking status: Former Smoker    Packs/day: 0.20    Years: 25.00    Pack years: 5.00    Types: Cigarettes    Quit date: 11/22/2011    Years since quitting: 7.7  . Smokeless tobacco: Never Used  . Tobacco comment: only smokes a few days a week  Substance and Sexual Activity  . Alcohol use: Yes    Alcohol/week: 0.0 standard drinks    Comment: very, very rarely  . Drug use: No  . Sexual activity: Never  Lifestyle  . Physical activity    Days per week: Not on file    Minutes per session: Not on file  . Stress: Not on file  Relationships  . Social Herbalist on phone: Not on file    Gets together: Not on file    Attends religious  service: Not on file    Active member of club or organization: Not on file    Attends meetings of clubs or organizations: Not on file    Relationship status: Not on file  . Intimate partner violence    Fear of current or ex partner: Not on file    Emotionally abused: Not on file    Physically abused: Not on file    Forced sexual activity: Not on file  Other Topics Concern  . Not on file  Social History Narrative  . Not on file    Family History  Problem Relation Age of Onset  . Cancer Mother        lung cancer age 36, colon cancer age 51  . Hypertension Mother   . Colon cancer Mother   . Heart disease Father   . COPD Father   . Stroke Sister   . Heart disease Sister   . Stroke Sister   . Heart disease Sister   . Colon cancer Maternal Aunt   . Esophageal cancer Neg Hx   . Stomach cancer Neg Hx   . Rectal cancer Neg Hx     Allergies  Allergen Reactions  .  Augmentin [Amoxicillin-Pot Clavulanate] Nausea And Vomiting  . Doxycycline   . Sulfa Antibiotics     Medication list reviewed and updated in full in [facility].   GEN: No acute illnesses, no fevers, chills. GI: No n/v/d, eating normally Pulm: No SOB Interactive and getting along well at home.  Otherwise, ROS is as per the HPI.  Objective:   BP 110/76   Pulse (!) 50   Temp 98.3 F (36.8 C) (Temporal)   Ht 5\' 5"  (1.651 m)   Wt 155 lb 4 oz (70.4 kg)   LMP 09/04/2014   SpO2 99%   BMI 25.83 kg/m   Orthostatic VS for the past 24 hrs:  BP- Lying Pulse- Lying BP- Sitting Pulse- Sitting BP- Standing at 0 minutes Pulse- Standing at 0 minutes  09/07/19 1113 123/74 56 119/71 55 120/77 58   GEN: WDWN, NAD, Non-toxic, A & O x 3 HEENT: Atraumatic, Normocephalic. Neck supple. No masses, No LAD. Ears and Nose: No external deformity. CV: RRR, No M/G/R. No JVD. No thrill. No extra heart sounds. PULM: CTA B, no wheezes, crackles, rhonchi. No retractions. No resp. distress. No accessory muscle use. EXTR: No c/c/e NEURO Normal gait.  PSYCH: Normally interactive. Conversant. Not depressed or anxious appearing.  Calm demeanor.   Laboratory and Imaging Data: Results for orders placed or performed in visit on [id number]  Basic metabolic panel  Result Value Ref Range   Sodium 137 135 - 145 mEq/L   Potassium 4.1 3.5 - 5.1 mEq/L   Chloride 103 96 - 112 mEq/L   CO2 28 19 - 32 mEq/L   Glucose, Bld 96 70 - 99 mg/dL   BUN 16 6 - 23 mg/dL   Creatinine, Ser 0.82 0.40 - 1.20 mg/dL   GFR 86.68 >60.00 mL/min   Calcium 9.9 8.4 - 10.5 mg/dL  CBC with Differential  Result Value Ref Range   WBC 4.0 4.0 - 10.5 K/uL   RBC 4.07 3.87 - 5.11 Mil/uL   Hemoglobin 12.2 12.0 - 15.0 g/dL   HCT 36.2 36.0 - 46.0 %   MCV 88.8 78.0 - 100.0 fl   MCHC 33.6 30.0 - 36.0 g/dL   RDW 13.6 11.5 - 15.5 %   Platelets 250.0 150.0 - 400.0 K/uL   Neutrophils  Relative % 36.4 (L) 43.0 - 77.0 %   Lymphocytes Relative  55.1 Repeated and verified X2. (H) 12.0 - 46.0 %   Monocytes Relative 7.4 3.0 - 12.0 %   Eosinophils Relative 0.6 0.0 - 5.0 %   Basophils Relative 0.5 0.0 - 3.0 %   Neutro Abs 1.4 1.4 - 7.7 K/uL   Lymphs Abs 2.2 0.7 - 4.0 K/uL   Monocytes Absolute 0.3 0.1 - 1.0 K/uL   Eosinophils Absolute 0.0 0.0 - 0.7 K/uL   Basophils Absolute 0.0 0.0 - 0.1 K/uL  Hepatic function panel  Result Value Ref Range   Total Bilirubin 0.6 0.2 - 1.2 mg/dL   Bilirubin, Direct 0.1 0.0 - 0.3 mg/dL   Alkaline Phosphatase 80 39 - 117 U/L   AST 19 0 - 37 U/L   ALT 16 0 - 35 U/L   Total Protein 7.5 6.0 - 8.3 g/dL   Albumin 4.4 3.5 - 5.2 g/dL  B12  Result Value Ref Range   Vitamin B-12 183 (L) 211 - 911 pg/mL  Vitamin D (25 hydroxy)  Result Value Ref Range   VITD 25.53 (L) 30.00 - 100.00 ng/mL  Hemoglobin A1c  Result Value Ref Range   Hgb A1c MFr Bld 5.7 4.6 - 6.5 %  IBC + Ferritin  Result Value Ref Range   Iron 101 42 - 145 ug/dL   Transferrin 229.0 212.0 - 360.0 mg/dL   Saturation Ratios 31.5 20.0 - 50.0 %   Ferritin 180.2 10.0 - 291.0 ng/mL  TSH  Result Value Ref Range   TSH 0.96 0.35 - 4.50 uIU/mL  POCT Urinalysis Dipstick (Automated)  Result Value Ref Range   Color, UA Yellow    Clarity, UA Clear    Glucose, UA Negative Negative   Bilirubin, UA Negative    Ketones, UA Negative    Spec Grav, UA 1.025 1.010 - 1.025   Blood, UA Negative    pH, UA 6.0 5.0 - 8.0   Protein, UA Negative Negative   Urobilinogen, UA 0.2 0.2 or 1.0 E.U./dL   Nitrite, UA Negative    Leukocytes, UA Negative Negative     Assessment and Plan:     ICD-10-CM   1. Dizzy  [id number] Basic metabolic panel    CBC with Differential    Hepatic function panel    B12    Vitamin D (25 hydroxy)    Hemoglobin A1c    IBC + Ferritin    TSH  2. Urinary frequency  R35.0 POCT Urinalysis Dipstick (Automated)    Urine culture  3. Lightheaded  [id number] Basic metabolic panel    CBC with Differential    Hepatic function panel    B12     Vitamin D (25 hydroxy)    Hemoglobin A1c    IBC + Ferritin    TSH  4. Weakness  [id number] Basic metabolic panel    CBC with Differential    Hepatic function panel    B12    Vitamin D (25 hydroxy)    Hemoglobin A1c    IBC + Ferritin    TSH   Multifactorial, but I suspect the patient who has low vitamin D as well as significant B12 is probably the driving factor here  Follow-up: No follow-ups on file.  No orders of the defined types were placed in this encounter.  Orders Placed This Encounter  Procedures  . Urine culture  . Basic metabolic panel  . CBC with Differential  . Hepatic function  panel  . B12  . Vitamin D (25 hydroxy)  . Hemoglobin A1c  . IBC + Ferritin  . TSH  . POCT Urinalysis Dipstick (Automated)    Signed,  Josephus Harriger T. Graycee Greeson, MD   Outpatient Encounter Medications as of 09/07/2019  Medication Sig  . timolol (TIMOPTIC) 0.5 % ophthalmic solution   . [DISCONTINUED] Cholecalciferol (VITAMIN D3) 1.25 MG (50000 UT) CAPS Take 1 capsule by mouth once a week.   No facility-administered encounter medications on file as of 09/07/2019.

## 2019-09-09 LAB — URINE CULTURE
MICRO NUMBER:: 1146060
Result:: NO GROWTH
SPECIMEN QUALITY:: ADEQUATE

## 2019-09-21 ENCOUNTER — Telehealth: Payer: Self-pay

## 2019-09-21 NOTE — Telephone Encounter (Signed)
Patient has a follow up appt scheduled on 10/07/19, and states she would like her B12 levels checked prior to this appt. After speaking with Butch Penny, patient was advised that it may be too soon to get her labs rechecked, as her number may not have changed that much. Patient is wondering why she even needs to come in for the 12/30 appt if we are not going to draw labs, and how much farther out she needs to be rescheduled to check these labs to maybe see a difference. Please advise.

## 2019-09-21 NOTE — Telephone Encounter (Signed)
Does not need follow up as long as feeling better.  Too early to recheck labs... if feeling better could re-check B12 and vit D in  3-6 months. If not feeling better would need to follow up.

## 2019-09-21 NOTE — Telephone Encounter (Signed)
[name] [name] notified as instructed by telephone.  She is still having the dizziness and fatigue and ask how long is she suppose to go on feeling this way.   I recommended that she keep her appointment as scheduled on 10/07/2019 with Dr. Diona Browner , since she is still feeling bad.  Patient is agreeable with keeping appointment as scheduled.

## 2019-10-06 ENCOUNTER — Telehealth: Payer: Self-pay | Admitting: Family Medicine

## 2019-10-06 MED ORDER — VITAMIN D3 50 MCG (2000 UT) PO TABS: 1.0000 | ORAL_TABLET | Freq: Every day | ORAL | 3 refills | Status: DC

## 2019-10-06 NOTE — Telephone Encounter (Signed)
Rx for Vit D3 2000 IU sent to [facility] in [place] as instructed by Dr. Lorelei Pont.

## 2019-10-06 NOTE — Telephone Encounter (Signed)
As Dr. Lorelei Pont recommended is fine.

## 2019-10-06 NOTE — Telephone Encounter (Signed)
Please see lab results from 09/07/2019 when patient was seen by Dr. Lorelei Pont.  Do you want to send in HIgh Dose Vit D or 2000 IU as Dr. Lorelei Pont recommended?  Please advise.

## 2019-10-06 NOTE — Telephone Encounter (Signed)
Called pt to confirm appointment pt canceled and wanted to get vit d rx sent [facility] [place]

## 2019-10-07 ENCOUNTER — Ambulatory Visit: Payer: Managed Care, Other (non HMO) | Admitting: Family Medicine

## 2019-12-24 ENCOUNTER — Ambulatory Visit: Payer: Managed Care, Other (non HMO) | Attending: Internal Medicine

## 2019-12-24 DIAGNOSIS — Z20822 Contact with and (suspected) exposure to covid-19: Secondary | ICD-10-CM

## 2019-12-26 LAB — NOVEL CORONAVIRUS, NAA: SARS-CoV-2, NAA: NOT DETECTED

## 2020-05-04 ENCOUNTER — Telehealth: Payer: Self-pay | Admitting: Family Medicine

## 2020-05-04 DIAGNOSIS — E782 Mixed hyperlipidemia: Secondary | ICD-10-CM

## 2020-05-04 DIAGNOSIS — E559 Vitamin D deficiency, unspecified: Secondary | ICD-10-CM

## 2020-05-04 NOTE — Telephone Encounter (Signed)
-----   Message from Cloyd Stagers, RT sent at 04/19/2020  1:45 PM EDT ----- Regarding: Lab Orders for Friday 7.30.2021 Please place lab orders for Friday 7.30.2021, office visit for physical on Tuesday 8.3.2021 Thank you, Dyke Maes RT(R)

## 2020-05-06 ENCOUNTER — Other Ambulatory Visit: Payer: Self-pay

## 2020-05-06 ENCOUNTER — Other Ambulatory Visit (INDEPENDENT_AMBULATORY_CARE_PROVIDER_SITE_OTHER): Payer: Managed Care, Other (non HMO)

## 2020-05-06 DIAGNOSIS — E782 Mixed hyperlipidemia: Secondary | ICD-10-CM

## 2020-05-06 DIAGNOSIS — E559 Vitamin D deficiency, unspecified: Secondary | ICD-10-CM

## 2020-05-06 LAB — COMPREHENSIVE METABOLIC PANEL
ALT: 13 U/L (ref 0–35)
AST: 18 U/L (ref 0–37)
Albumin: 4.7 g/dL (ref 3.5–5.2)
Alkaline Phosphatase: 85 U/L (ref 39–117)
BUN: 10 mg/dL (ref 6–23)
CO2: 28 mEq/L (ref 19–32)
Calcium: 10.2 mg/dL (ref 8.4–10.5)
Chloride: 106 mEq/L (ref 96–112)
Creatinine, Ser: 0.92 mg/dL (ref 0.40–1.20)
GFR: 75.72 mL/min (ref >60.00)
Glucose, Bld: 92 mg/dL (ref 70–99)
Potassium: 4.3 mEq/L (ref 3.5–5.1)
Sodium: 139 mEq/L (ref 135–145)
Total Bilirubin: 0.5 mg/dL (ref 0.2–1.2)
Total Protein: 7.8 g/dL (ref 6.0–8.3)

## 2020-05-06 LAB — LIPID PANEL
Cholesterol: 225 mg/dL — ABNORMAL HIGH (ref 0–200)
HDL: 49.3 mg/dL (ref >39.00)
LDL Cholesterol: 154 mg/dL — ABNORMAL HIGH (ref 0–99)
NonHDL: 175.71
Total CHOL/HDL Ratio: 5
Triglycerides: 111 mg/dL (ref 0.0–149.0)
VLDL: 22.2 mg/dL (ref 0.0–40.0)

## 2020-05-06 LAB — VITAMIN D 25 HYDROXY (VIT D DEFICIENCY, FRACTURES): VITD: 27.76 ng/mL — ABNORMAL LOW (ref 30.00–100.00)

## 2020-05-06 NOTE — Progress Notes (Signed)
No critical labs need to be addressed urgently. We will discuss labs in detail at upcoming office visit.   

## 2020-05-10 ENCOUNTER — Encounter: Payer: Self-pay | Admitting: Family Medicine

## 2020-05-10 ENCOUNTER — Other Ambulatory Visit: Payer: Self-pay

## 2020-05-10 ENCOUNTER — Ambulatory Visit (INDEPENDENT_AMBULATORY_CARE_PROVIDER_SITE_OTHER): Payer: Managed Care, Other (non HMO) | Admitting: Family Medicine

## 2020-05-10 VITALS — BP 90/60 | HR 58 | Temp 97.9°F | Ht 64.5 in | Wt 152.8 lb

## 2020-05-10 DIAGNOSIS — E782 Mixed hyperlipidemia: Secondary | ICD-10-CM | POA: Diagnosis not present

## 2020-05-10 DIAGNOSIS — E559 Vitamin D deficiency, unspecified: Secondary | ICD-10-CM

## 2020-05-10 DIAGNOSIS — Z0000 Encounter for general adult medical examination without abnormal findings: Secondary | ICD-10-CM

## 2020-05-10 MED ORDER — IBUPROFEN 800 MG PO TABS: 800.0000 mg | ORAL_TABLET | Freq: Three times a day (TID) | ORAL | 3 refills | Status: DC | PRN

## 2020-05-10 MED ORDER — VITAMIN D (ERGOCALCIFEROL) 1.25 MG (50000 UNIT) PO CAPS: 50000.0000 [IU] | ORAL_CAPSULE | ORAL | 0 refills | Status: DC

## 2020-05-10 NOTE — Progress Notes (Signed)
Chief Complaint  Patient presents with  . Annual Exam    History of Present Illness: HPI  The patient is here for annual wellness exam and preventative care.     She has noted months of intermittent pain in RLQ.Marland Kitchen seems improved with resolution constipation. Chronic constipation.  No blood in stool.  BMs occurring once a week. Poor water and  Good fiber intake.   Low vit D: need supplementation  Elevated Cholesterol:  Lab Results  Component Value Date   CHOL 225 (H) 05/06/2020   HDL 49.30 05/06/2020   LDLCALC 154 (H) 05/06/2020   LDLDIRECT 126.6 12/22/2012   TRIG 111.0 05/06/2020   CHOLHDL 5 05/06/2020  The 10-year ASCVD risk score Mikey Bussing DC Jr., et al., 2013) is: 1.8%   Values used to calculate the score:     Age: 59 years     Sex: Female     Is Non-Hispanic African American: Yes     Diabetic: No     Tobacco smoker: No     Systolic Blood Pressure: 90 mmHg     Is BP treated: No     HDL Cholesterol: 49.3 mg/dL     Total Cholesterol: 225 mg/dL Using medications without problems: Muscle aches:  Diet compliance: good Exercise:  walkingdaily to several times a week Other complaints: Wt Readings from Last 3 Encounters:  05/10/20 152 lb 12 oz (69.3 kg)  09/07/19 155 lb 4 oz (70.4 kg)  05/08/19 153 lb 8 oz (69.6 kg)     This visit occurred during the SARS-CoV-2 public health emergency.  Safety protocols were in place, including screening questions prior to the visit, additional usage of staff PPE, and extensive cleaning of exam room while observing appropriate contact time as indicated for disinfecting solutions.   COVID 19 screen:  No recent travel or known exposure to COVID19 The patient denies respiratory symptoms of COVID 19 at this time. The importance of social distancing was discussed today.     Review of Systems  Constitutional: Negative for chills and fever.  HENT: Negative for congestion and ear pain.   Eyes: Negative for pain and redness.  Respiratory:  Negative for cough and shortness of breath.   Cardiovascular: Negative for chest pain, palpitations and leg swelling.  Gastrointestinal: Positive for constipation. Negative for abdominal pain, blood in stool, diarrhea, nausea and vomiting.  Genitourinary: Negative for dysuria.  Musculoskeletal: Negative for falls and myalgias.  Skin: Negative for rash.  Neurological: Negative for dizziness.  Psychiatric/Behavioral: Negative for depression. The patient is not nervous/anxious.       Past Medical History:  Diagnosis Date  . Glaucoma     reports that she quit smoking about 8 years ago. Her smoking use included cigarettes. She has a 5.00 pack-year smoking history. She has never used smokeless tobacco. She reports current alcohol use. She reports that she does not use drugs.   Current Outpatient Medications:  .  timolol (TIMOPTIC) 0.5 % ophthalmic solution, , Disp: , Rfl:    Observations/Objective: Blood pressure 90/60, pulse (!) 58, temperature 97.9 F (36.6 C), temperature source Temporal, height 5' 4.5" (1.638 m), weight 152 lb 12 oz (69.3 kg), last menstrual period 09/04/2014, SpO2 100 %.  Physical Exam Constitutional:      General: She is not in acute distress.    Appearance: Normal appearance. She is well-developed. She is not ill-appearing or toxic-appearing.  HENT:     Head: Normocephalic.     Right Ear: Hearing, tympanic membrane, ear canal  and external ear normal. Tympanic membrane is not erythematous, retracted or bulging.     Left Ear: Hearing, tympanic membrane, ear canal and external ear normal. Tympanic membrane is not erythematous, retracted or bulging.     Nose: Nose normal. No mucosal edema or rhinorrhea.     Right Sinus: No maxillary sinus tenderness or frontal sinus tenderness.     Left Sinus: No maxillary sinus tenderness or frontal sinus tenderness.     Mouth/Throat:     Pharynx: Uvula midline.  Eyes:     General: Lids are normal. Lids are everted, no foreign  bodies appreciated.     Conjunctiva/sclera: Conjunctivae normal.     Pupils: Pupils are equal, round, and reactive to light.  Neck:     Thyroid: No thyroid mass or thyromegaly.     Vascular: No carotid bruit.     Trachea: Trachea normal.  Cardiovascular:     Rate and Rhythm: Normal rate and regular rhythm.     Pulses: Normal pulses.     Heart sounds: Normal heart sounds, S1 normal and S2 normal. No murmur heard.  No friction rub. No gallop.   Pulmonary:     Effort: Pulmonary effort is normal. No tachypnea or respiratory distress.     Breath sounds: Normal breath sounds. No decreased breath sounds, wheezing, rhonchi or rales.  Abdominal:     General: Bowel sounds are normal. There is no distension or abdominal bruit.     Palpations: Abdomen is soft. There is no fluid wave or mass.     Tenderness: There is no abdominal tenderness. There is no guarding or rebound.     Hernia: No hernia is present.  Musculoskeletal:     Cervical back: Normal range of motion and neck supple.  Lymphadenopathy:     Cervical: No cervical adenopathy.  Skin:    General: Skin is warm and dry.     Findings: No rash.  Neurological:     Mental Status: She is alert.     Cranial Nerves: No cranial nerve deficit.     Sensory: No sensory deficit.  Psychiatric:        Mood and Affect: Mood is not anxious or depressed.        Speech: Speech normal.        Behavior: Behavior normal. Behavior is cooperative.        Thought Content: Thought content normal.        Judgment: Judgment normal.      Assessment and Plan   The patient's preventative maintenance and recommended screening tests for an annual wellness exam were reviewed in full today. Brought up to date unless services declined.  Counselled on the importance of diet, exercise, and its role in overall health and mortality. The patient's FH and SH was reviewed, including their home life, tobacco status, and drug and alcohol status.   Former smoker GYN:  Dr. Neita Goodnight pap 2018. Get records [place] office. Vaccine: Uptodate with Tdap. S/P COVID19 Colon: 09/2018 nml, int hemorrhoids, Dr. Fuller Plan,  repeat in 5 years. Hep C: negative HIV declined  Hyperlipidemia Encouraged exercise, weight loss, healthy eating habits.   Vitamin D deficiency Replete.    Eliezer Lofts, MD

## 2020-05-10 NOTE — Patient Instructions (Addendum)
Increase water and fiber in diet.  Work on healthy eating and low cholesterol diet. Get back to regualr exercsie.

## 2020-05-10 NOTE — Assessment & Plan Note (Signed)
Replete

## 2020-05-10 NOTE — Assessment & Plan Note (Signed)
Encouraged exercise, weight loss, healthy eating habits. ? ?

## 2020-07-22 ENCOUNTER — Other Ambulatory Visit: Payer: Self-pay

## 2020-07-22 ENCOUNTER — Ambulatory Visit (INDEPENDENT_AMBULATORY_CARE_PROVIDER_SITE_OTHER): Payer: Managed Care, Other (non HMO) | Admitting: Family Medicine

## 2020-07-22 ENCOUNTER — Encounter: Payer: Self-pay | Admitting: Family Medicine

## 2020-07-22 VITALS — BP 122/74 | HR 58 | Temp 98.0°F | Ht 64.5 in | Wt 155.0 lb

## 2020-07-22 DIAGNOSIS — R079 Chest pain, unspecified: Secondary | ICD-10-CM | POA: Diagnosis not present

## 2020-07-22 DIAGNOSIS — M549 Dorsalgia, unspecified: Secondary | ICD-10-CM | POA: Diagnosis not present

## 2020-07-22 MED ORDER — CYCLOBENZAPRINE HCL 10 MG PO TABS
10.0000 mg | ORAL_TABLET | Freq: Every evening | ORAL | 0 refills | Status: DC | PRN
Start: 2020-07-22 — End: 2020-07-23

## 2020-07-22 NOTE — Progress Notes (Signed)
Chief Complaint  Patient presents with  . Left Leg Cramping  . Right Shoulder Pain and Left Arm Pain    History of Present Illness: HPI   59 year old female presents with multiple issues. She reports she was feeling well until  2 weeks ago following the massage.   1. Right shoulder pain.Marland Kitchen started first after massage... seem to improve with the massage but then after ward it hurts.  Pain feels like pressure in right upper back posterior shoulder.. worse with lying back No arm numbness or weakness.  2. Yesterday left arm and hand started to ache.  last few hours then went away  3.Left leg cramping x 1-2 months.. when stretching at night.  CMET nml 04/2020  4. Yesterday also had pain in central  Chest.. lasted 1-2 hours, sharp No associated sweating, SOB, palpitations No GERD.   She has been under more stress lately More irritated lately, irritable.   Ibuprofen 800 ng off and on has not helped.  Naproxen did not help.Marland Kitchen amde her tired.    This visit occurred during the SARS-CoV-2 public health emergency.  Safety protocols were in place, including screening questions prior to the visit, additional usage of staff PPE, and extensive cleaning of exam room while observing appropriate contact time as indicated for disinfecting solutions.   COVID 19 screen:  No recent travel or known exposure to COVID19 The patient denies respiratory symptoms of COVID 19 at this time. The importance of social distancing was discussed today.     Review of Systems  Constitutional: Negative for chills and fever.  HENT: Negative for congestion and ear pain.   Eyes: Negative for pain and redness.  Respiratory: Negative for cough and shortness of breath.   Cardiovascular: Positive for chest pain. Negative for palpitations and leg swelling.  Gastrointestinal: Negative for abdominal pain, blood in stool, constipation, diarrhea, nausea and vomiting.  Genitourinary: Negative for dysuria.   Musculoskeletal: Negative for falls and myalgias.  Skin: Negative for rash.  Neurological: Negative for dizziness.  Psychiatric/Behavioral: Negative for depression. The patient is not nervous/anxious.       Past Medical History:  Diagnosis Date  . Glaucoma     reports that she quit smoking about 8 years ago. Her smoking use included cigarettes. She has a 5.00 pack-year smoking history. She has never used smokeless tobacco. She reports current alcohol use. She reports that she does not use drugs.   Current Outpatient Medications:  .  ibuprofen (ADVIL) 800 MG tablet, Take 1 tablet (800 mg total) by mouth every 8 (eight) hours as needed., Disp: 30 tablet, Rfl: 3 .  timolol (TIMOPTIC) 0.5 % ophthalmic solution, , Disp: , Rfl:    Observations/Objective: Blood pressure 122/74, pulse (!) 58, temperature 98 F (36.7 C), height 5' 4.5" (1.638 m), weight 155 lb (70.3 kg), last menstrual period 09/04/2014, SpO2 97 %.  Physical Exam Constitutional:      General: She is not in acute distress.    Appearance: Normal appearance. She is well-developed. She is not ill-appearing or toxic-appearing.  HENT:     Head: Normocephalic.     Right Ear: Hearing, tympanic membrane, ear canal and external ear normal. Tympanic membrane is not erythematous, retracted or bulging.     Left Ear: Hearing, tympanic membrane, ear canal and external ear normal. Tympanic membrane is not erythematous, retracted or bulging.     Nose: No mucosal edema or rhinorrhea.     Right Sinus: No maxillary sinus tenderness or frontal sinus  tenderness.     Left Sinus: No maxillary sinus tenderness or frontal sinus tenderness.     Mouth/Throat:     Pharynx: Uvula midline.  Eyes:     General: Lids are normal. Lids are everted, no foreign bodies appreciated.     Conjunctiva/sclera: Conjunctivae normal.     Pupils: Pupils are equal, round, and reactive to light.  Neck:     Thyroid: No thyroid mass or thyromegaly.     Vascular: No  carotid bruit.     Trachea: Trachea normal.  Cardiovascular:     Rate and Rhythm: Normal rate and regular rhythm.     Pulses: Normal pulses.     Heart sounds: Normal heart sounds, S1 normal and S2 normal. No murmur heard.  No friction rub. No gallop.   Pulmonary:     Effort: Pulmonary effort is normal. No tachypnea or respiratory distress.     Breath sounds: Normal breath sounds. No decreased breath sounds, wheezing, rhonchi or rales.  Abdominal:     General: Bowel sounds are normal.     Palpations: Abdomen is soft.     Tenderness: There is no abdominal tenderness.  Musculoskeletal:     Right shoulder: Tenderness present. No bony tenderness. Decreased range of motion.     Cervical back: Normal range of motion and neck supple. Tenderness present. Normal range of motion.     Thoracic back: Normal.     Lumbar back: Normal.  Skin:    General: Skin is warm and dry.     Findings: No rash.  Neurological:     Mental Status: She is alert.  Psychiatric:        Mood and Affect: Mood is not anxious or depressed.        Speech: Speech normal.        Behavior: Behavior normal. Behavior is cooperative.        Thought Content: Thought content normal.        Judgment: Judgment normal.      Assessment and Plan   Upper back pain on right side Start muscle relaxant at night, start gentle upper back stretching.  Call if not improving as expected.    Chest pain in adult Not consistent with cardiopulmonary source. No red flags.  EKG: Normal sinus rhythm. Normal axis, normal R wave progression, No acute ST elevation or depression.  Most likely MSK pain. Return precautions given.     Eliezer Lofts, MD

## 2020-07-22 NOTE — Patient Instructions (Addendum)
Start muscle relaxant at night, start gentle upper back stretching.  Call if not improving as expected.   

## 2020-07-23 MED ORDER — CYCLOBENZAPRINE HCL 10 MG PO TABS: 10.0000 mg | ORAL_TABLET | Freq: Every evening | ORAL | 0 refills | Status: DC | PRN

## 2020-07-26 ENCOUNTER — Telehealth: Payer: Self-pay | Admitting: *Deleted

## 2020-07-26 NOTE — Telephone Encounter (Signed)
Patient left a voicemail stating that she does not need a call back just wanted Dr. Diona Browner to know about her shoulder. Patient stated that she took one of the muscle relaxer's and it helped her shoulder. Patient stated that she thinks that the problem with her shoulder was due to the computer work. Patient stated that her shoulder is okay and the medication worked. Patient stated that no further treatment is needed.

## 2020-07-26 NOTE — Telephone Encounter (Signed)
Great!

## 2020-09-12 DIAGNOSIS — M5441 Lumbago with sciatica, right side: Secondary | ICD-10-CM | POA: Insufficient documentation

## 2020-09-12 DIAGNOSIS — M549 Dorsalgia, unspecified: Secondary | ICD-10-CM | POA: Insufficient documentation

## 2020-09-12 HISTORY — DX: Dorsalgia, unspecified: M54.9

## 2020-09-12 NOTE — Assessment & Plan Note (Signed)
Start muscle relaxant at night, start gentle upper back stretching.  Call if not improving as expected.

## 2020-09-12 NOTE — Assessment & Plan Note (Addendum)
Not consistent with cardiopulmonary source. No red flags.  EKG: Normal sinus rhythm. Normal axis, normal R wave progression, No acute ST elevation or depression.  Most likely MSK pain. Return precautions given.

## 2020-12-23 ENCOUNTER — Emergency Department (HOSPITAL_BASED_OUTPATIENT_CLINIC_OR_DEPARTMENT_OTHER)
Admission: EM | Admit: 2020-12-23 | Discharge: 2020-12-23 | Disposition: A | Discharge status: Home / Self Care | Payer: Managed Care, Other (non HMO) | Attending: Emergency Medicine | Admitting: Emergency Medicine

## 2020-12-23 ENCOUNTER — Other Ambulatory Visit: Payer: Self-pay

## 2020-12-23 ENCOUNTER — Encounter (HOSPITAL_BASED_OUTPATIENT_CLINIC_OR_DEPARTMENT_OTHER): Payer: Self-pay | Admitting: *Deleted

## 2020-12-23 DIAGNOSIS — R030 Elevated blood-pressure reading, without diagnosis of hypertension: Secondary | ICD-10-CM | POA: Insufficient documentation

## 2020-12-23 DIAGNOSIS — Z5321 Procedure and treatment not carried out due to patient leaving prior to being seen by health care provider: Secondary | ICD-10-CM | POA: Insufficient documentation

## 2020-12-23 DIAGNOSIS — M25511 Pain in right shoulder: Secondary | ICD-10-CM | POA: Insufficient documentation

## 2020-12-23 NOTE — ED Triage Notes (Signed)
She has felt like a feather for the past 2 days. She feels like she cant breathe. She bought a BP cuff and her BP is elevated. Tension in her right shoulder and eye. She is able to text with no difficulty. Ambulatory.

## 2021-06-14 ENCOUNTER — Telehealth: Payer: Self-pay | Admitting: Family Medicine

## 2021-06-14 DIAGNOSIS — E559 Vitamin D deficiency, unspecified: Secondary | ICD-10-CM

## 2021-06-14 DIAGNOSIS — E782 Mixed hyperlipidemia: Secondary | ICD-10-CM

## 2021-06-14 DIAGNOSIS — H409 Unspecified glaucoma: Secondary | ICD-10-CM | POA: Insufficient documentation

## 2021-06-14 NOTE — Telephone Encounter (Signed)
-----   Message from Ellamae Sia sent at 06/05/2021 10:10 AM EDT ----- Regarding: Lab orders for Wednesday, 9.14.22 Patient is scheduled for CPX labs, please order future labs, Thanks , Karna Christmas

## 2021-06-16 ENCOUNTER — Telehealth: Payer: Self-pay | Admitting: Family Medicine

## 2021-06-16 DIAGNOSIS — E782 Mixed hyperlipidemia: Secondary | ICD-10-CM

## 2021-06-16 DIAGNOSIS — E559 Vitamin D deficiency, unspecified: Secondary | ICD-10-CM

## 2021-06-16 NOTE — Telephone Encounter (Signed)
-----   Message from Ellamae Sia sent at 06/05/2021 10:10 AM EDT ----- Regarding: Lab orders for Wednesday, 9.14.22 Patient is scheduled for CPX labs, please order future labs, Thanks , Karna Christmas

## 2021-06-19 ENCOUNTER — Other Ambulatory Visit: Payer: Self-pay | Admitting: Family Medicine

## 2021-06-19 DIAGNOSIS — E559 Vitamin D deficiency, unspecified: Secondary | ICD-10-CM

## 2021-06-19 DIAGNOSIS — E782 Mixed hyperlipidemia: Secondary | ICD-10-CM

## 2021-06-20 ENCOUNTER — Telehealth: Payer: Self-pay

## 2021-06-20 NOTE — Telephone Encounter (Signed)
Error

## 2021-06-20 NOTE — Telephone Encounter (Signed)
Called patient to advise that [facility] is closed and to go to [facility] for labs patient. After scheduling at [facility], patient wanted to know about transferring to Dr.Rudd as she now lives in [facility] and doesn't want to make the drive to [facility] [employer].   Okay for Transfer?

## 2021-06-20 NOTE — Telephone Encounter (Signed)
She is a very pleasant lady, there is no  contraindication to the transfer as long as it is okay with Dr. Gena Fray.

## 2021-06-21 ENCOUNTER — Other Ambulatory Visit: Payer: Self-pay

## 2021-06-21 ENCOUNTER — Other Ambulatory Visit (INDEPENDENT_AMBULATORY_CARE_PROVIDER_SITE_OTHER): Payer: 59

## 2021-06-21 DIAGNOSIS — E559 Vitamin D deficiency, unspecified: Secondary | ICD-10-CM | POA: Diagnosis not present

## 2021-06-21 DIAGNOSIS — E782 Mixed hyperlipidemia: Secondary | ICD-10-CM | POA: Diagnosis not present

## 2021-06-21 LAB — COMPREHENSIVE METABOLIC PANEL
ALT: 17 U/L (ref 0–35)
AST: 21 U/L (ref 0–37)
Albumin: 4.5 g/dL (ref 3.5–5.2)
Alkaline Phosphatase: 73 U/L (ref 39–117)
BUN: 16 mg/dL (ref 6–23)
CO2: 25 mEq/L (ref 19–32)
Calcium: 10 mg/dL (ref 8.4–10.5)
Chloride: 104 mEq/L (ref 96–112)
Creatinine, Ser: 0.81 mg/dL (ref 0.40–1.20)
GFR: 79.32 mL/min (ref >60.00)
Glucose, Bld: 92 mg/dL (ref 70–99)
Potassium: 4 mEq/L (ref 3.5–5.1)
Sodium: 138 mEq/L (ref 135–145)
Total Bilirubin: 0.6 mg/dL (ref 0.2–1.2)
Total Protein: 7.9 g/dL (ref 6.0–8.3)

## 2021-06-21 LAB — LIPID PANEL
Cholesterol: 235 mg/dL — ABNORMAL HIGH (ref 0–200)
HDL: 57 mg/dL (ref >39.00)
LDL Cholesterol: 152 mg/dL — ABNORMAL HIGH (ref 0–99)
NonHDL: 177.65
Total CHOL/HDL Ratio: 4
Triglycerides: 127 mg/dL (ref 0.0–149.0)
VLDL: 25.4 mg/dL (ref 0.0–40.0)

## 2021-06-21 LAB — VITAMIN D 25 HYDROXY (VIT D DEFICIENCY, FRACTURES): VITD: 19.74 ng/mL — ABNORMAL LOW (ref 30.00–100.00)

## 2021-06-22 NOTE — Progress Notes (Signed)
No critical labs need to be addressed urgently. We will discuss labs in detail at upcoming office visit.   

## 2021-06-26 NOTE — Telephone Encounter (Signed)
Patient is aware, and will schedule something tomorrow.

## 2021-06-27 ENCOUNTER — Other Ambulatory Visit: Payer: Self-pay

## 2021-06-27 ENCOUNTER — Encounter: Payer: Self-pay | Admitting: Family Medicine

## 2021-06-27 ENCOUNTER — Ambulatory Visit (INDEPENDENT_AMBULATORY_CARE_PROVIDER_SITE_OTHER): Payer: 59 | Admitting: Family Medicine

## 2021-06-27 VITALS — BP 112/64 | HR 60 | Temp 98.2°F | Ht 66.0 in | Wt 154.0 lb

## 2021-06-27 DIAGNOSIS — E782 Mixed hyperlipidemia: Secondary | ICD-10-CM | POA: Diagnosis not present

## 2021-06-27 DIAGNOSIS — Z0000 Encounter for general adult medical examination without abnormal findings: Secondary | ICD-10-CM | POA: Diagnosis not present

## 2021-06-27 DIAGNOSIS — E559 Vitamin D deficiency, unspecified: Secondary | ICD-10-CM

## 2021-06-27 MED ORDER — VITAMIN D (ERGOCALCIFEROL) 1.25 MG (50000 UNIT) PO CAPS: 50000.0000 [IU] | ORAL_CAPSULE | ORAL | 0 refills | Status: DC

## 2021-06-27 MED ORDER — IBUPROFEN 800 MG PO TABS: 800.0000 mg | ORAL_TABLET | Freq: Three times a day (TID) | ORAL | 0 refills | Status: DC | PRN

## 2021-06-27 NOTE — Progress Notes (Signed)
Patient ID: [name] [name], female    DOB: 1961-10-06, 60 y.o.   MRN: [id number]  This visit was conducted in person.  BP 112/64   Pulse 60   Temp 98.2 F (36.8 C) (Temporal)   Ht 5\' 6"  (1.676 m)   Wt 154 lb (69.9 kg)   LMP 09/04/2014   SpO2 99%   BMI 24.86 kg/m    CC: Chief Complaint  Patient presents with   Annual Exam    Subjective:   HPI: MARGURETTE [name] is a 60 y.o. female presenting on 06/27/2021 for Annual Exam  Reviewed labs in detail with patient.  BP Readings from Last 3 Encounters:  06/27/21 112/64  12/23/20 129/79  07/22/20 122/74    Elevated Cholesterol:  Lab Results  Component Value Date   CHOL 235 (H) 06/21/2021   HDL 57.00 06/21/2021   LDLCALC 152 (H) 06/21/2021   LDLDIRECT 126.6 12/22/2012   TRIG 127.0 06/21/2021   CHOLHDL 4 06/21/2021    Using medications without problems: Muscle aches:  Diet compliance: Exercise: Other complaints: The 10-year ASCVD risk score (Arnett DK, et al., 2019) is: 3.6%   Values used to calculate the score:     Age: 52 years     Sex: Female     Is Non-Hispanic African American: Yes     Diabetic: No     Tobacco smoker: No     Systolic Blood Pressure: 740 mmHg     Is BP treated: No     HDL Cholesterol: 57 mg/dL     Total Cholesterol: 235 mg/dL   Vit D def poor control.    She has intermittent neck pain.Marland Kitchen ibuprofen 800 mg daily helps a lot.      Relevant past medical, surgical, family and social history reviewed and updated as indicated. Interim medical history since our last visit reviewed. Allergies and medications reviewed and updated. Outpatient Medications Prior to Visit  Medication Sig Dispense Refill   timolol (TIMOPTIC) 0.5 % ophthalmic solution      cyclobenzaprine (FLEXERIL) 10 MG tablet Take 1 tablet (10 mg total) by mouth at bedtime as needed for muscle spasms. 15 tablet 0   ibuprofen (ADVIL) 800 MG tablet Take 1 tablet (800 mg total) by mouth every 8 (eight) hours as needed. 30  tablet 3   No facility-administered medications prior to visit.     Per HPI unless specifically indicated in ROS section below Review of Systems  Constitutional:  Negative for fatigue and fever.  HENT:  Negative for congestion.   Eyes:  Negative for pain.  Respiratory:  Negative for cough and shortness of breath.   Cardiovascular:  Negative for chest pain, palpitations and leg swelling.  Gastrointestinal:  Negative for abdominal pain.  Genitourinary:  Negative for dysuria and vaginal bleeding.  Musculoskeletal:  Negative for back pain.  Neurological:  Negative for syncope, light-headedness and headaches.  Psychiatric/Behavioral:  Negative for dysphoric mood.   Objective:  BP 112/64   Pulse 60   Temp 98.2 F (36.8 C) (Temporal)   Ht 5\' 6"  (1.676 m)   Wt 154 lb (69.9 kg)   LMP 09/04/2014   SpO2 99%   BMI 24.86 kg/m   Wt Readings from Last 3 Encounters:  06/27/21 154 lb (69.9 kg)  12/23/20 155 lb 4.8 oz (70.4 kg)  07/22/20 155 lb (70.3 kg)      Physical Exam Vitals and nursing note reviewed.  Constitutional:      General: She is not  in acute distress.    Appearance: Normal appearance. She is well-developed. She is not ill-appearing or toxic-appearing.  HENT:     Head: Normocephalic.     Right Ear: Hearing, tympanic membrane, ear canal and external ear normal.     Left Ear: Hearing, tympanic membrane, ear canal and external ear normal.     Nose: Nose normal.  Eyes:     General: Lids are normal. Lids are everted, no foreign bodies appreciated.     Conjunctiva/sclera: Conjunctivae normal.     Pupils: Pupils are equal, round, and reactive to light.  Neck:     Thyroid: No thyroid mass or thyromegaly.     Vascular: No carotid bruit.     Trachea: Trachea normal.  Cardiovascular:     Rate and Rhythm: Normal rate and regular rhythm.     Heart sounds: Normal heart sounds, S1 normal and S2 normal. No murmur heard.   No gallop.  Pulmonary:     Effort: Pulmonary effort is  normal. No respiratory distress.     Breath sounds: Normal breath sounds. No wheezing, rhonchi or rales.  Abdominal:     General: Bowel sounds are normal. There is no distension or abdominal bruit.     Palpations: Abdomen is soft. There is no fluid wave or mass.     Tenderness: There is no abdominal tenderness. There is no guarding or rebound.     Hernia: No hernia is present.  Musculoskeletal:     Cervical back: Normal range of motion and neck supple.  Lymphadenopathy:     Cervical: No cervical adenopathy.  Skin:    General: Skin is warm and dry.     Findings: No rash.  Neurological:     Mental Status: She is alert.     Cranial Nerves: No cranial nerve deficit.     Sensory: No sensory deficit.  Psychiatric:        Mood and Affect: Mood is not anxious or depressed.        Speech: Speech normal.        Behavior: Behavior normal. Behavior is cooperative.        Judgment: Judgment normal.      Results for orders placed or performed in visit on 06/21/21  VITAMIN D 25 Hydroxy (Vit-D Deficiency, Fractures)  Result Value Ref Range   VITD 19.74 (L) 30.00 - 100.00 ng/mL  Comprehensive metabolic panel  Result Value Ref Range   Sodium 138 135 - 145 mEq/L   Potassium 4.0 3.5 - 5.1 mEq/L   Chloride 104 96 - 112 mEq/L   CO2 25 19 - 32 mEq/L   Glucose, Bld 92 70 - 99 mg/dL   BUN 16 6 - 23 mg/dL   Creatinine, Ser 0.81 0.40 - 1.20 mg/dL   Total Bilirubin 0.6 0.2 - 1.2 mg/dL   Alkaline Phosphatase 73 39 - 117 U/L   AST 21 0 - 37 U/L   ALT 17 0 - 35 U/L   Total Protein 7.9 6.0 - 8.3 g/dL   Albumin 4.5 3.5 - 5.2 g/dL   GFR 79.32 >60.00 mL/min   Calcium 10.0 8.4 - 10.5 mg/dL  Lipid panel  Result Value Ref Range   Cholesterol 235 (H) 0 - 200 mg/dL   Triglycerides 127.0 0.0 - 149.0 mg/dL   HDL 57.00 >39.00 mg/dL   VLDL 25.4 0.0 - 40.0 mg/dL   LDL Cholesterol 152 (H) 0 - 99 mg/dL   Total CHOL/HDL Ratio 4    NonHDL  177.65     This visit occurred during the SARS-CoV-2 public health  emergency.  Safety protocols were in place, including screening questions prior to the visit, additional usage of staff PPE, and extensive cleaning of exam room while observing appropriate contact time as indicated for disinfecting solutions.   COVID 19 screen:  No recent travel or known exposure to COVID19 The patient denies respiratory symptoms of COVID 19 at this time. The importance of social distancing was discussed today.   Assessment and Plan   The patient's preventative maintenance and recommended screening tests for an annual wellness exam were reviewed in full today. Brought up to date unless services declined.  Counselled on the importance of diet, exercise, and its role in overall health and mortality. The patient's FH and SH was reviewed, including their home life, tobacco status, and drug and alcohol status.   Former smoker GYN: Dr. Ulanda Edison 2018 pap 2018.  Mammogram:  Will set. Vaccine: Uptodate  S/P COVID19 x 3,  flu   ((refused) and tdap due,  Colon: 09/2018 nml, int hemorrhoids, Dr. Fuller Plan,  repeat in 5 years.  Hep C: negative  HIV declined  Problem List Items Addressed This Visit     Hyperlipidemia     Worsened control, but no indication for statin.  Encouraged exercise, weight management, healthy eating habits.       Vitamin D deficiency    Replete with 12 week course.      Other Visit Diagnoses     Routine general medical examination at a health care facility    -  Primary         Eliezer Lofts, MD

## 2021-06-27 NOTE — Assessment & Plan Note (Signed)
Worsened control, but no indication for statin.  Encouraged exercise, weight management, healthy eating habits.

## 2021-06-27 NOTE — Assessment & Plan Note (Signed)
Replete with 12 week course.

## 2021-06-27 NOTE — Patient Instructions (Addendum)
Replete with 12 week course of Vit D.   Work on low cholesterol diet.   Look into Shingrix, flu and Tetanus vaccines. Consider Bivalent COVID vaccine.  Please call the location of your choice from the menu below to schedule your Mammogram and/or Bone Density appointment.    [facility]                      Phone:  [phone], [place] [address]                                                             Services: Traditional and 3D Mammogram, [facility]                 Phone: [phone] [address], [place] [address]    Service: Bone Density ONLY   *this site does NOT perform mammograms  [facility]                        Phone:  [phone] [address], [place] [address]                                            Services:  3D Mammogram and [place] at [facility]   Phone:  [phone]   [place], [place] [address]                                            Services: 3D Mammogram and [place]  [place] at [facility])  Phone:  [phone]   [address]  [place], [place] [address]                                              Services:  3D Mammogram and Bone Density  

## 2022-01-22 NOTE — Telephone Encounter (Signed)
error 

## 2022-01-30 ENCOUNTER — Ambulatory Visit (INDEPENDENT_AMBULATORY_CARE_PROVIDER_SITE_OTHER): Payer: 59 | Admitting: Family Medicine

## 2022-01-30 ENCOUNTER — Encounter: Payer: Self-pay | Admitting: Family Medicine

## 2022-01-30 VITALS — BP 114/62 | HR 62 | Temp 98.6°F | Resp 16 | Ht 66.0 in | Wt 152.4 lb

## 2022-01-30 DIAGNOSIS — E782 Mixed hyperlipidemia: Secondary | ICD-10-CM

## 2022-01-30 DIAGNOSIS — R5383 Other fatigue: Secondary | ICD-10-CM

## 2022-01-30 NOTE — Progress Notes (Signed)
? ? Patient ID: [name] [name], female    DOB: December 06, 1960, 61 y.o.   MRN: [id number] ? ?This visit was conducted in person. ? ?BP 114/62   Pulse 62   Temp 98.6 ?F (37 ?C)   Resp 16   Ht '5\' 6"'$  (1.676 m)   Wt 152 lb 6 oz (69.1 kg)   LMP 09/04/2014   SpO2 100%   BMI 24.59 kg/m?   ? ?CC: ?Chief Complaint  ?Patient presents with  ? Fatigue  ? ? ?Subjective:  ? ?HPI: ?[name] [name] is a 61 y.o. female presenting on 01/30/2022 for Fatigue ? ? She has been feeling more fatigued lately. Decreased motivation. ? She is concerned her Vit D and B12 are low. ? Hair falling out. ? She started taking vitamins and biotin... now feeling better. ? ?Just needed labs , no appt needed. ?   ? ?Relevant past medical, surgical, family and social history reviewed and updated as indicated. Interim medical history since our last visit reviewed. ?Allergies and medications reviewed and updated. ?Outpatient Medications Prior to Visit  ?Medication Sig Dispense Refill  ? Biotin 1 MG CAPS Take by mouth.    ? Cholecalciferol (D3-1000 PO) Take by mouth.    ? Cyanocobalamin (B-12) 5000 MCG SUBL Place under the tongue.    ? ibuprofen (ADVIL) 800 MG tablet Take 1 tablet (800 mg total) by mouth every 8 (eight) hours as needed. 60 tablet 0  ? timolol (TIMOPTIC) 0.5 % ophthalmic solution     ? Vitamin D, Ergocalciferol, (DRISDOL) 1.25 MG (50000 UNIT) CAPS capsule Take 1 capsule (50,000 Units total) by mouth every 7 (seven) days. (Patient not taking: Reported on 01/30/2022) 12 capsule 0  ? ?No facility-administered medications prior to visit.  ?  ? ?Per HPI unless specifically indicated in ROS section below ?Review of Systems ?Objective:  ?BP 114/62   Pulse 62   Temp 98.6 ?F (37 ?C)   Resp 16   Ht '5\' 6"'$  (1.676 m)   Wt 152 lb 6 oz (69.1 kg)   LMP 09/04/2014   SpO2 100%   BMI 24.59 kg/m?   ?Wt Readings from Last 3 Encounters:  ?01/30/22 152 lb 6 oz (69.1 kg)  ?06/27/21 154 lb (69.9 kg)  ?12/23/20 155 lb 4.8 oz (70.4 kg)  ?  ?  ?Physical  Exam ?   ?Results for orders placed or performed in visit on 06/21/21  ?VITAMIN D 25 Hydroxy (Vit-D Deficiency, Fractures)  ?Result Value Ref Range  ? VITD 19.74 (L) 30.00 - 100.00 ng/mL  ?Comprehensive metabolic panel  ?Result Value Ref Range  ? Sodium 138 135 - 145 mEq/L  ? Potassium 4.0 3.5 - 5.1 mEq/L  ? Chloride 104 96 - 112 mEq/L  ? CO2 25 19 - 32 mEq/L  ? Glucose, Bld 92 70 - 99 mg/dL  ? BUN 16 6 - 23 mg/dL  ? Creatinine, Ser 0.81 0.40 - 1.20 mg/dL  ? Total Bilirubin 0.6 0.2 - 1.2 mg/dL  ? Alkaline Phosphatase 73 39 - 117 U/L  ? AST 21 0 - 37 U/L  ? ALT 17 0 - 35 U/L  ? Total Protein 7.9 6.0 - 8.3 g/dL  ? Albumin 4.5 3.5 - 5.2 g/dL  ? GFR 79.32 >60.00 mL/min  ? Calcium 10.0 8.4 - 10.5 mg/dL  ?Lipid panel  ?Result Value Ref Range  ? Cholesterol 235 (H) 0 - 200 mg/dL  ? Triglycerides 127.0 0.0 - 149.0 mg/dL  ? HDL 57.00 >39.00  mg/dL  ? VLDL 25.4 0.0 - 40.0 mg/dL  ? LDL Cholesterol 152 (H) 0 - 99 mg/dL  ? Total CHOL/HDL Ratio 4   ? NonHDL 177.65   ? ? ?This visit occurred during the SARS-CoV-2 public health emergency.  Safety protocols were in place, including screening questions prior to the visit, additional usage of staff PPE, and extensive cleaning of exam room while observing appropriate contact time as indicated for disinfecting solutions.  ? ?COVID 19 screen:  No recent travel or known exposure to [place] ?The patient denies respiratory symptoms of COVID 19 at this time. ?The importance of social distancing was discussed today.  ? ?Assessment and Plan ? ?  ? ?Eliezer Lofts, MD  ? ?

## 2022-02-15 LAB — HM MAMMOGRAPHY

## 2022-02-20 LAB — HM PAP SMEAR
HM Pap smear: NEGATIVE
HPV, high-risk: NEGATIVE

## 2022-02-27 ENCOUNTER — Other Ambulatory Visit (INDEPENDENT_AMBULATORY_CARE_PROVIDER_SITE_OTHER): Payer: 59

## 2022-02-27 DIAGNOSIS — E782 Mixed hyperlipidemia: Secondary | ICD-10-CM | POA: Diagnosis not present

## 2022-02-27 DIAGNOSIS — R5383 Other fatigue: Secondary | ICD-10-CM | POA: Diagnosis not present

## 2022-02-27 LAB — COMPREHENSIVE METABOLIC PANEL
ALT: 12 U/L (ref 0–35)
AST: 17 U/L (ref 0–37)
Albumin: 4.7 g/dL (ref 3.5–5.2)
Alkaline Phosphatase: 78 U/L (ref 39–117)
BUN: 16 mg/dL (ref 6–23)
CO2: 29 mEq/L (ref 19–32)
Calcium: 10.1 mg/dL (ref 8.4–10.5)
Chloride: 103 mEq/L (ref 96–112)
Creatinine, Ser: 0.91 mg/dL (ref 0.40–1.20)
GFR: 68.65 mL/min (ref >60.00)
Glucose, Bld: 94 mg/dL (ref 70–99)
Potassium: 4 mEq/L (ref 3.5–5.1)
Sodium: 139 mEq/L (ref 135–145)
Total Bilirubin: 0.8 mg/dL (ref 0.2–1.2)
Total Protein: 7.6 g/dL (ref 6.0–8.3)

## 2022-02-27 LAB — CBC WITH DIFFERENTIAL/PLATELET
Basophils Absolute: 0 10*3/uL (ref 0.0–0.1)
Basophils Relative: 0.5 % (ref 0.0–3.0)
Eosinophils Absolute: 0 10*3/uL (ref 0.0–0.7)
Eosinophils Relative: 1 % (ref 0.0–5.0)
HCT: 36.3 % (ref 36.0–46.0)
Hemoglobin: 12.3 g/dL (ref 12.0–15.0)
Lymphocytes Relative: 52.9 % — ABNORMAL HIGH (ref 12.0–46.0)
Lymphs Abs: 2.3 10*3/uL (ref 0.7–4.0)
MCHC: 34 g/dL (ref 30.0–36.0)
MCV: 87.7 fl (ref 78.0–100.0)
Monocytes Absolute: 0.3 10*3/uL (ref 0.1–1.0)
Monocytes Relative: 6.8 % (ref 3.0–12.0)
Neutro Abs: 1.7 10*3/uL (ref 1.4–7.7)
Neutrophils Relative %: 38.8 % — ABNORMAL LOW (ref 43.0–77.0)
Platelets: 245 10*3/uL (ref 150.0–400.0)
RBC: 4.14 Mil/uL (ref 3.87–5.11)
RDW: 13.7 % (ref 11.5–15.5)
WBC: 4.4 10*3/uL (ref 4.0–10.5)

## 2022-02-27 LAB — LIPID PANEL
Cholesterol: 239 mg/dL — ABNORMAL HIGH (ref 0–200)
HDL: 57.1 mg/dL (ref >39.00)
LDL Cholesterol: 157 mg/dL — ABNORMAL HIGH (ref 0–99)
NonHDL: 181.84
Total CHOL/HDL Ratio: 4
Triglycerides: 123 mg/dL (ref 0.0–149.0)
VLDL: 24.6 mg/dL (ref 0.0–40.0)

## 2022-02-27 LAB — HEMOGLOBIN A1C: Hgb A1c MFr Bld: 5.8 % (ref 4.6–6.5)

## 2022-02-27 LAB — VITAMIN B12: Vitamin B-12: >1504 pg/mL — ABNORMAL HIGH (ref 211–911)

## 2022-02-27 LAB — VITAMIN D 25 HYDROXY (VIT D DEFICIENCY, FRACTURES): VITD: 24.87 ng/mL — ABNORMAL LOW (ref 30.00–100.00)

## 2022-02-27 LAB — TSH: TSH: 3.18 u[IU]/mL (ref 0.35–5.50)

## 2022-02-27 NOTE — Progress Notes (Signed)
No critical labs need to be addressed urgently. We will discuss labs in detail at upcoming office visit.   

## 2022-03-06 ENCOUNTER — Encounter: Payer: Self-pay | Admitting: Family Medicine

## 2022-03-06 ENCOUNTER — Ambulatory Visit (INDEPENDENT_AMBULATORY_CARE_PROVIDER_SITE_OTHER): Payer: 59 | Admitting: Family Medicine

## 2022-03-06 VITALS — BP 100/68 | HR 55 | Temp 97.8°F | Ht 64.75 in | Wt 151.3 lb

## 2022-03-06 DIAGNOSIS — Z0000 Encounter for general adult medical examination without abnormal findings: Secondary | ICD-10-CM

## 2022-03-06 DIAGNOSIS — E782 Mixed hyperlipidemia: Secondary | ICD-10-CM | POA: Diagnosis not present

## 2022-03-06 DIAGNOSIS — E559 Vitamin D deficiency, unspecified: Secondary | ICD-10-CM

## 2022-03-06 DIAGNOSIS — J4520 Mild intermittent asthma, uncomplicated: Secondary | ICD-10-CM | POA: Diagnosis not present

## 2022-03-06 MED ORDER — IBUPROFEN 800 MG PO TABS: 800.0000 mg | ORAL_TABLET | Freq: Three times a day (TID) | ORAL | 0 refills | Status: DC | PRN

## 2022-03-06 MED ORDER — VITAMIN D (ERGOCALCIFEROL) 1.25 MG (50000 UNIT) PO CAPS: 50000.0000 [IU] | ORAL_CAPSULE | ORAL | 0 refills | Status: DC

## 2022-03-06 NOTE — Assessment & Plan Note (Signed)
Stable control, minimal flares.

## 2022-03-06 NOTE — Patient Instructions (Signed)
Get back on track with regular exercise.

## 2022-03-06 NOTE — Assessment & Plan Note (Signed)
Chronic, inadequate control  Increase supplementation

## 2022-03-06 NOTE — Progress Notes (Signed)
Patient ID: [name] [name], female    DOB: 1961/01/20, 61 y.o.   MRN: [id number]  This visit was conducted in person.  LMP 09/04/2014    Vitals:   03/06/22 1036  BP: 100/68  Pulse: (!) 55  Temp: 97.8 F (36.6 C)  SpO2: 99%     CC:  Chief Complaint  Patient presents with   Annual Exam    Subjective:   HPI: [name] [name] is a 61 y.o. female presenting on 03/06/2022 for Annual Exam  Elevated Cholesterol: Inadequate control on no medication.  Given her lack of other risk factors she has decided to continue to work on lifestyle change. Lab Results  Component Value Date   CHOL 239 (H) 02/27/2022   HDL 57.10 02/27/2022   LDLCALC 157 (H) 02/27/2022   LDLDIRECT 126.6 12/22/2012   TRIG 123.0 02/27/2022   CHOLHDL 4 02/27/2022   The 10-year ASCVD risk score (Arnett DK, et al., 2019) is: 4.1%   Values used to calculate the score:     Age: 15 years     Sex: Female     Is Non-Hispanic African American: Yes     Diabetic: No     Tobacco smoker: No     Systolic Blood Pressure: 902 mmHg     Is BP treated: No     HDL Cholesterol: 57.1 mg/dL     Total Cholesterol: 239 mg/dL Using medications without problems: Muscle aches:  Diet compliance: good Exercise: minimal lately Other complaints: She is not interested in medication to treat.  Mild intermittent asthma:  stable   Vit D def: remains low on supplement     Relevant past medical, surgical, family and social history reviewed and updated as indicated. Interim medical history since our last visit reviewed. Allergies and medications reviewed and updated. Outpatient Medications Prior to Visit  Medication Sig Dispense Refill   Biotin 1 MG CAPS Take by mouth.     Cholecalciferol (D3-1000 PO) Take by mouth.     Cyanocobalamin (B-12) 5000 MCG SUBL Place under the tongue.     ibuprofen (ADVIL) 800 MG tablet Take 1 tablet (800 mg total) by mouth every 8 (eight) hours as needed. 60 tablet 0   timolol (TIMOPTIC) 0.5 %  ophthalmic solution      Vitamin D, Ergocalciferol, (DRISDOL) 1.25 MG (50000 UNIT) CAPS capsule Take 1 capsule (50,000 Units total) by mouth every 7 (seven) days. (Patient not taking: Reported on 01/30/2022) 12 capsule 0   No facility-administered medications prior to visit.     Per HPI unless specifically indicated in ROS section below Review of Systems  Constitutional:  Negative for fatigue and fever.  HENT:  Negative for congestion.   Eyes:  Negative for pain.  Respiratory:  Negative for cough and shortness of breath.   Cardiovascular:  Negative for chest pain, palpitations and leg swelling.  Gastrointestinal:  Negative for abdominal pain.  Genitourinary:  Negative for dysuria and vaginal bleeding.  Musculoskeletal:  Negative for back pain.  Neurological:  Negative for syncope, light-headedness and headaches.  Psychiatric/Behavioral:  Negative for dysphoric mood.   Objective:  LMP 09/04/2014   Wt Readings from Last 3 Encounters:  01/30/22 152 lb 6 oz (69.1 kg)  06/27/21 154 lb (69.9 kg)  12/23/20 155 lb 4.8 oz (70.4 kg)      Physical Exam Vitals and nursing note reviewed.  Constitutional:      General: She is not in acute distress.    Appearance: Normal appearance.  She is well-developed. She is not ill-appearing or toxic-appearing.  HENT:     Head: Normocephalic.     Right Ear: Hearing, tympanic membrane, ear canal and external ear normal.     Left Ear: Hearing, tympanic membrane, ear canal and external ear normal.     Nose: Nose normal.  Eyes:     General: Lids are normal. Lids are everted, no foreign bodies appreciated.     Conjunctiva/sclera: Conjunctivae normal.     Pupils: Pupils are equal, round, and reactive to light.  Neck:     Thyroid: No thyroid mass or thyromegaly.     Vascular: No carotid bruit.     Trachea: Trachea normal.  Cardiovascular:     Rate and Rhythm: Normal rate and regular rhythm.     Heart sounds: Normal heart sounds, S1 normal and S2 normal.  No murmur heard.   No gallop.  Pulmonary:     Effort: Pulmonary effort is normal. No respiratory distress.     Breath sounds: Normal breath sounds. No wheezing, rhonchi or rales.  Abdominal:     General: Bowel sounds are normal. There is no distension or abdominal bruit.     Palpations: Abdomen is soft. There is no fluid wave or mass.     Tenderness: There is no abdominal tenderness. There is no guarding or rebound.     Hernia: No hernia is present.  Musculoskeletal:     Cervical back: Normal range of motion and neck supple.  Lymphadenopathy:     Cervical: No cervical adenopathy.  Skin:    General: Skin is warm and dry.     Findings: No rash.  Neurological:     Mental Status: She is alert.     Cranial Nerves: No cranial nerve deficit.     Sensory: No sensory deficit.  Psychiatric:        Mood and Affect: Mood is not anxious or depressed.        Speech: Speech normal.        Behavior: Behavior normal. Behavior is cooperative.        Judgment: Judgment normal.      Results for orders placed or performed in visit on 02/27/22  Comprehensive metabolic panel  Result Value Ref Range   Sodium 139 135 - 145 mEq/L   Potassium 4.0 3.5 - 5.1 mEq/L   Chloride 103 96 - 112 mEq/L   CO2 29 19 - 32 mEq/L   Glucose, Bld 94 70 - 99 mg/dL   BUN 16 6 - 23 mg/dL   Creatinine, Ser 0.91 0.40 - 1.20 mg/dL   Total Bilirubin 0.8 0.2 - 1.2 mg/dL   Alkaline Phosphatase 78 39 - 117 U/L   AST 17 0 - 37 U/L   ALT 12 0 - 35 U/L   Total Protein 7.6 6.0 - 8.3 g/dL   Albumin 4.7 3.5 - 5.2 g/dL   GFR 68.65 >60.00 mL/min   Calcium 10.1 8.4 - 10.5 mg/dL  Lipid panel  Result Value Ref Range   Cholesterol 239 (H) 0 - 200 mg/dL   Triglycerides 123.0 0.0 - 149.0 mg/dL   HDL 57.10 >39.00 mg/dL   VLDL 24.6 0.0 - 40.0 mg/dL   LDL Cholesterol 157 (H) 0 - 99 mg/dL   Total CHOL/HDL Ratio 4    NonHDL 181.84   Hemoglobin A1c  Result Value Ref Range   Hgb A1c MFr Bld 5.8 4.6 - 6.5 %  Vitamin B12  Result Value  Ref Range  Vitamin B-12 >1504 (H) 211 - 911 pg/mL  VITAMIN D 25 Hydroxy (Vit-D Deficiency, Fractures)  Result Value Ref Range   VITD 24.87 (L) 30.00 - 100.00 ng/mL  TSH  Result Value Ref Range   TSH 3.18 0.35 - 5.50 uIU/mL  CBC with Differential/Platelet  Result Value Ref Range   WBC 4.4 4.0 - 10.5 K/uL   RBC 4.14 3.87 - 5.11 Mil/uL   Hemoglobin 12.3 12.0 - 15.0 g/dL   HCT 36.3 36.0 - 46.0 %   MCV 87.7 78.0 - 100.0 fl   MCHC 34.0 30.0 - 36.0 g/dL   RDW 13.7 11.5 - 15.5 %   Platelets 245.0 150.0 - 400.0 K/uL   Neutrophils Relative % 38.8 (L) 43.0 - 77.0 %   Lymphocytes Relative 52.9 (H) 12.0 - 46.0 %   Monocytes Relative 6.8 3.0 - 12.0 %   Eosinophils Relative 1.0 0.0 - 5.0 %   Basophils Relative 0.5 0.0 - 3.0 %   Neutro Abs 1.7 1.4 - 7.7 K/uL   Lymphs Abs 2.3 0.7 - 4.0 K/uL   Monocytes Absolute 0.3 0.1 - 1.0 K/uL   Eosinophils Absolute 0.0 0.0 - 0.7 K/uL   Basophils Absolute 0.0 0.0 - 0.1 K/uL     COVID 19 screen:  No recent travel or known exposure to COVID19 The patient denies respiratory symptoms of COVID 19 at this time. The importance of social distancing was discussed today.   Assessment and Plan The patient's preventative maintenance and recommended screening tests for an annual wellness exam were reviewed in full today. Brought up to date unless services declined.  Counselled on the importance of diet, exercise, and its role in overall health and mortality. The patient's FH and SH was reviewed, including their home life, tobacco status, and drug and alcohol status.   Former smoker GYN: Dr. Ulanda Edison 2018 pap 2018.  Mammogram:  05/2018  GYN  had 2 weeks ago Vaccine: Uptodate  S/P COVID19 x 3,  Shingrix and tdap due, not interested Colon: 09/2018 nml, int hemorrhoids, Dr. Fuller Plan,  repeat in 5 years.  Hep C: negative  HIV declined   Problem List Items Addressed This Visit     Hyperlipidemia    Inadequate control on no medication.  Given her lack of other risk  factors she has decided to continue to work on lifestyle change.      Mild intermittent asthma    Stable control, minimal flares.       Vitamin D deficiency    Chronic, inadequate control  Increase supplementation       Other Visit Diagnoses     Routine general medical examination at a health care facility    -  Primary      Meds ordered this encounter  Medications   ibuprofen (ADVIL) 800 MG tablet    Sig: Take 1 tablet (800 mg total) by mouth every 8 (eight) hours as needed.    Dispense:  60 tablet    Refill:  0   Vitamin D, Ergocalciferol, (DRISDOL) 1.25 MG (50000 UNIT) CAPS capsule    Sig: Take 1 capsule (50,000 Units total) by mouth every 7 (seven) days.    Dispense:  12 capsule    Refill:  0     Eliezer Lofts, MD

## 2022-03-06 NOTE — Assessment & Plan Note (Signed)
Inadequate control on no medication.  Given her lack of other risk factors she has decided to continue to work on lifestyle change.

## 2022-04-03 ENCOUNTER — Telehealth: Payer: Self-pay | Admitting: Family Medicine

## 2022-04-03 DIAGNOSIS — M5412 Radiculopathy, cervical region: Secondary | ICD-10-CM
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
## 2022-04-04 MED ORDER — PREDNISONE 20 MG PO TABS: ORAL_TABLET | ORAL | 0 refills | Status: DC

## 2022-04-04 NOTE — Telephone Encounter (Signed)
Patient notified as instructed by telephone and verbalized understanding. Patient stated that this problem as been going on for about 2 years. Patient stated that the neck pain has gotten worse recently and she discussed this with Dr. Diona Browner the last time that she was in the office. Patient stated that the pain is constant and her friend that is a nurse thinks that she may have a pinched nerve. Patient stated that she is having to take Ibuprofen 800 mg almost every day. Patient stated that she does not want to come back into the office. Patient stated that she just wants a referral to a neurologist. Patient stated that maybe she can see an orthopedist in the meantime until she can get in with a neurologist. Patient wants to know if Dr. Diona Browner can recommend an orthopedist. Patient declined an office visit for this issue.

## 2022-04-04 NOTE — Telephone Encounter (Signed)
I will send in a course of prednisone for her to try as I do agree it is likely a pinched nerve.  The main reason  I wanted her to come in for an office visit was to get an x-ray of her neck , but if she prefers she can do this at an orthopedist. Referral to an orthopedist or neurosurgeon is what would be indicated. A neurologist does not handle this issue and is therefore not appropriate referral.  I will  place a referral for an orthopedic MD and send in an RX for a prednisone taper.

## 2022-04-04 NOTE — Addendum Note (Signed)
Addended by: Carter Kitten on: 04/04/2022 04:24 PM   Modules accepted: Orders

## 2022-04-04 NOTE — Telephone Encounter (Signed)
Mrs. Burr notified as instructed by telephone.  She states they are out of town and that is why she couldn't come in.   She states she can't even pick up the medication.  I advised I could resend if she could give me a pharmacy where they are on vacation.  Pred[facility]one Rx [facility] [place], [place].  She also states she already has an appointment with an orthopedist set up for when she gets back into town. She is agreeable to not doing the referral to a neurologist.  She will try the prednisone and if her symptoms are not improved with medication, she will keep ortho appointment as scheduled.   FYI to Dr. Diona Browner.

## 2022-05-23 ENCOUNTER — Telehealth: Payer: Self-pay | Admitting: Family Medicine

## 2022-05-23 NOTE — Telephone Encounter (Signed)
Please call patient and schedule an appointment. Patient will need an appointment to be seen or at least a virtual visit for medication.

## 2022-05-23 NOTE — Telephone Encounter (Signed)
Patient called and stated if Dr. Diona Browner will get something called in for her sinuses. Call back number [phone].

## 2022-05-23 NOTE — Telephone Encounter (Signed)
Spoke to patient by telephone and was advised that she started feeling bad last Monday with a head cold. Patient stated that she did a coivd  test last Tuesday and it was negative. Patient stated that she started feeling some better and then started feeling bad again this week. Patient stated that she has sinus pressure, head congestion and feels real tired. Patient denied a fever, SOB or difficulty breathing. Patient scheduled for a video visit with Dr. Diona Browner tomorrow 05/24/22. Patient was given ER precautions and she verbalized understanding. Patient stated that she plans on doing another covid test today.

## 2022-05-24 ENCOUNTER — Encounter: Payer: Self-pay | Admitting: Family Medicine

## 2022-05-24 ENCOUNTER — Telehealth (INDEPENDENT_AMBULATORY_CARE_PROVIDER_SITE_OTHER): Payer: 59 | Admitting: Family Medicine

## 2022-05-24 ENCOUNTER — Telehealth: Payer: 59 | Admitting: Family Medicine

## 2022-05-24 VITALS — BP 120/69 | HR 68 | Temp 98.7°F | Ht 64.75 in | Wt 151.0 lb

## 2022-05-24 DIAGNOSIS — U071 COVID-19: Secondary | ICD-10-CM

## 2022-05-24 MED ORDER — IBUPROFEN 800 MG PO TABS: 800.0000 mg | ORAL_TABLET | Freq: Three times a day (TID) | ORAL | 0 refills | Status: DC | PRN

## 2022-05-24 NOTE — Progress Notes (Signed)
VIRTUAL VISIT A virtual visit is felt to be most appropriate for this patient at this time.   I connected with the patient on 05/24/22 at 12:00 PM EDT by virtual telehealth platform and verified that I am speaking with the correct person using two identifiers.   I discussed the limitations, risks, security and privacy concerns of performing an evaluation and management service by  virtual telehealth platform and the availability of in person appointments. I also discussed with the patient that there may be a patient responsible charge related to this service. The patient expressed understanding and agreed to proceed.  Patient location: Home Provider Location: [place] Participants: Eliezer Lofts and Jerene Bears   Chief Complaint  Patient presents with   Covid Positive    C/o sinus pressure, body aches, HA, SOB and fatigue.  Sxs started 05/15/22. Tried Mucinex, Neti Pot, Benadryl and ibuprofen. Pos home COIVD- 05/24/22.    History of Present Illness:   Date of Onset: 9 days ago  Initial symptoms congestion, sneeze, fatigue.  Started after going on a cruise. Started feeling better last weekend,  whent back to usual activity, in heat then started feeling bad again in last 4 days.   Using Theraflu, mucinex, netty pot last   Started feeling increase shortness of breath, no wheeze.  Feeling fatigued, body ache    Initial COVID test negative  last Wedneday.  COVID test yesterday positive., .    Review of Systems  Constitutional:  Negative for chills and fever.  HENT:  Positive for congestion. Negative for ear pain.   Eyes:  Negative for pain and redness.  Respiratory:  Positive for cough and wheezing. Negative for shortness of breath.   Cardiovascular:  Negative for chest pain, palpitations and leg swelling.  Gastrointestinal:  Negative for abdominal pain, blood in stool, constipation, diarrhea, nausea and vomiting.  Genitourinary:  Negative for dysuria.   Musculoskeletal:  Negative for falls and myalgias.  Skin:  Negative for rash.  Neurological:  Negative for dizziness.  Psychiatric/Behavioral:  Negative for depression. The patient is not nervous/anxious.       Past Medical History:  Diagnosis Date   Glaucoma     reports that she quit smoking about 10 years ago. Her smoking use included cigarettes. She has a 5.00 pack-year smoking history. She has never used smokeless tobacco. She reports current alcohol use. She reports that she does not use drugs.   Current Outpatient Medications:    Biotin 1 MG CAPS, Take by mouth., Disp: , Rfl:    Cholecalciferol (D3-1000 PO), Take by mouth., Disp: , Rfl:    Cyanocobalamin (B-12) 5000 MCG SUBL, Place under the tongue., Disp: , Rfl:    ibuprofen (ADVIL) 800 MG tablet, Take 1 tablet (800 mg total) by mouth every 8 (eight) hours as needed., Disp: 60 tablet, Rfl: 0   predniSONE (DELTASONE) 20 MG tablet, 3 tabs by mouth daily x 3 days, then 2 tabs by mouth daily x 2 days then 1 tab by mouth daily x 2 days, Disp: 15 tablet, Rfl: 0   timolol (TIMOPTIC) 0.5 % ophthalmic solution, , Disp: , Rfl:    Vitamin D, Ergocalciferol, (DRISDOL) 1.25 MG (50000 UNIT) CAPS capsule, Take 1 capsule (50,000 Units total) by mouth every 7 (seven) days., Disp: 12 capsule, Rfl: 0   Observations/Objective: Blood pressure 120/69, pulse 68, temperature 98.7 F (37.1 C), height 5' 4.75" (1.645 m), weight 151 lb (68.5 kg), last menstrual period 09/04/2014.  Physical Exam  Physical Exam Constitutional:      General: The patient is not in acute distress. Pulmonary:     Effort: Pulmonary effort is normal. No respiratory distress.  Neurological:     Mental Status: The patient is alert and oriented to person, place, and time.  Psychiatric:        Mood and Affect: Mood normal.        Behavior: Behavior normal.   Assessment and Plan Problem List Items Addressed This Visit     COVID-19 - Primary    COVID19  Infection 9 days  from onset of symptoms in triple vaccinated individual. No clear sign of bacterial infection at this time.   No SOB.  No red flags/need for ER visit or in-person exam at respiratory clinic at this time..    Pt higher risk for COVID complications given  age. She is not interested in [place] and is past the treatable time frame.     Symptomatic care with mucinex, ibuprofen and cough suppressant at night. If SOB begins symptoms worsening.. have low threshold for in-person exam, if severe shortness of breath ER visit recommended.  Can monitor Oxygen saturation at home with home monitor if able to obtain.  Go to ER if O2 sat < 90% on room air.   Reviewed home care and provided information through [facility].  Recommended quarantine 5 days isolation recommended. Return to work day 6 and wear mask for 4 more days to complete 10 days. Provided info about prevention of spread of COVID 19.       Meds ordered this encounter  Medications   ibuprofen (ADVIL) 800 MG tablet    Sig: Take 1 tablet (800 mg total) by mouth every 8 (eight) hours as needed.    Dispense:  60 tablet    Refill:  0       I discussed the assessment and treatment plan with the patient. The patient was provided an opportunity to ask questions and all were answered. The patient agreed with the plan and demonstrated an understanding of the instructions.   The patient was advised to call back or seek an in-person evaluation if the symptoms worsen or if the condition fails to improve as anticipated.     Eliezer Lofts, MD

## 2022-05-27 DIAGNOSIS — U071 COVID-19: Secondary | ICD-10-CM

## 2022-05-27 HISTORY — DX: COVID-19: U07.1

## 2022-05-27 NOTE — Assessment & Plan Note (Signed)
COVID19  Infection 9 days from onset of symptoms in triple vaccinated individual. No clear sign of bacterial infection at this time.   No SOB.  No red flags/need for ER visit or in-person exam at respiratory clinic at this time..    Pt higher risk for COVID complications given  age. She is not interested in [place] and is past the treatable time frame.     Symptomatic care with mucinex, ibuprofen and cough suppressant at night. If SOB begins symptoms worsening.. have low threshold for in-person exam, if severe shortness of breath ER visit recommended.  Can monitor Oxygen saturation at home with home monitor if able to obtain.  Go to ER if O2 sat < 90% on room air.   Reviewed home care and provided information through [facility].  Recommended quarantine 5 days isolation recommended. Return to work day 6 and wear mask for 4 more days to complete 10 days. Provided info about prevention of spread of COVID 19.

## 2022-05-30 ENCOUNTER — Telehealth: Payer: Self-pay

## 2022-05-30 NOTE — Telephone Encounter (Signed)
Patient did a visit with Dr.Bedsole on Monday and is still not feeling any better. Symptoms still are fatigued, nauseated, and no energy. Taking pepto almost everyday but wants to know what she can do to help with symptoms.

## 2022-05-31 ENCOUNTER — Ambulatory Visit (INDEPENDENT_AMBULATORY_CARE_PROVIDER_SITE_OTHER): Payer: 59 | Admitting: Nurse Practitioner

## 2022-05-31 ENCOUNTER — Encounter: Payer: Self-pay | Admitting: Nurse Practitioner

## 2022-05-31 ENCOUNTER — Ambulatory Visit: Payer: 59 | Admitting: Family Medicine

## 2022-05-31 VITALS — BP 96/63 | HR 65 | Temp 96.7°F | Wt 151.8 lb

## 2022-05-31 DIAGNOSIS — R5383 Other fatigue: Secondary | ICD-10-CM | POA: Diagnosis not present

## 2022-05-31 DIAGNOSIS — R0602 Shortness of breath: Secondary | ICD-10-CM | POA: Diagnosis not present

## 2022-05-31 DIAGNOSIS — R110 Nausea: Secondary | ICD-10-CM | POA: Diagnosis not present

## 2022-05-31 MED ORDER — ONDANSETRON HCL 4 MG PO TABS: 4.0000 mg | ORAL_TABLET | Freq: Three times a day (TID) | ORAL | 0 refills | Status: DC | PRN

## 2022-05-31 NOTE — Patient Instructions (Addendum)
It was great to see you!  We are checking your labs today and will let you know the results via mychart/phone.   Make sure you get plenty of rest and drink plenty of fluids. Slowly increase your activity. You can start zofran every 8 hours as needed for nausea.   Let's follow-up if your symptoms worsen or any concerns.   Take care,  Vance Peper, NP

## 2022-05-31 NOTE — Telephone Encounter (Signed)
Patient called in following up on this matter. Stated she would like for someone to give her a call as soon as they can. Thank you!

## 2022-05-31 NOTE — Telephone Encounter (Signed)
Patient is scheduled to see Dr. Lorelei Pont this afternoon at 3:00 pm.

## 2022-05-31 NOTE — Telephone Encounter (Signed)
Agree, please triage as patient missed appointment with Dr. Lorelei Pont today.  She was diagnosed with COVID on August 17.  At that point she had had symptoms ongoing 9 days but then felt significantly better with a recurrence of symptoms.  She was treated with an antiviral and given a cough suppressant.

## 2022-05-31 NOTE — Telephone Encounter (Signed)
Please call and triage patient. She has cancelled her appointment this afternoon with Dr. Lorelei Pont.

## 2022-05-31 NOTE — Progress Notes (Signed)
Acute Office Visit  Subjective:     Patient ID: SEQUOIA WITZ, female    DOB: [birth year], 61 y.o.   MRN: [id number]  Chief Complaint  Patient presents with   Acute Visit    Pt tested pos for Covid on 8/15, pt states since then she has experience dizziness, mild SOB, nausea, and extreme fatigue x1 wk    HPI Patient is in today for severe fatigue, nausea, and shortness of breath with speaking for the past week.  She was diagnosed with COVID-19 and her symptoms have started 05/14/2022.  She states that her symptoms she had originally with Covid went away. She did a tea detox over this past weekend. Then on Monday, she noticed severe fatigue and nausea. She went to the gym today and worked out for 2 minutes and then had to take a nap. She endorses nausea that improves with eating. She has taken peptobismol which has also helped her symptoms.   ROS See pertinent positives and negatives per HPI.     Objective:    BP 96/63   Pulse 65   Temp (!) 96.7 F (35.9 C) (Temporal)   Wt 151 lb 12.8 oz (68.9 kg)   LMP 09/04/2014   SpO2 100%   BMI 25.46 kg/m    Physical Exam Vitals and nursing note reviewed.  Constitutional:      General: She is not in acute distress.    Appearance: Normal appearance.  HENT:     Head: Normocephalic.  Eyes:     Conjunctiva/sclera: Conjunctivae normal.  Cardiovascular:     Rate and Rhythm: Normal rate and regular rhythm.     Pulses: Normal pulses.     Heart sounds: Normal heart sounds.  Pulmonary:     Effort: Pulmonary effort is normal.     Breath sounds: Normal breath sounds.  Musculoskeletal:     Cervical back: Normal range of motion.  Skin:    General: Skin is warm.  Neurological:     General: No focal deficit present.     Mental Status: She is alert and oriented to person, place, and time.  Psychiatric:        Mood and Affect: Mood normal.        Behavior: Behavior normal.        Thought Content: Thought content normal.        Judgment:  Judgment normal.       Assessment & Plan:   Problem List Items Addressed This Visit       Other   Fatigue - Primary    She started experiencing significant fatigue 4 days ago and is recovering from COVID-19.  She states that her other symptoms of COVID have gone away.  Discussed that COVID can cause long-term fatigue after illness.  Encouraged her to rest, get plenty of sleep every night and to slowly increase her activity.  We will check a CMP, CBC, and TSH today.  Follow-up if her symptoms worsen or do not improve.      Relevant Orders   CBC with Differential/Platelet   Comprehensive metabolic panel   TSH   Other Visit Diagnoses     Nausea       Will have her start zofran '4mg'$  TID prn. Encouraged her to eat regularly throughout the day and drink plenty of fluids.    Shortness of breath       Mild with talking since COVID.  Lungs clear on exam, oxygen levels 100% on room  air. Check CMP, CBC today.        Meds ordered this encounter  Medications   ondansetron (ZOFRAN) 4 MG tablet    Sig: Take 1 tablet (4 mg total) by mouth every 8 (eight) hours as needed for nausea or vomiting.    Dispense:  30 tablet    Refill:  0    Return if symptoms worsen or fail to improve.  Charyl Dancer, NP

## 2022-05-31 NOTE — Assessment & Plan Note (Signed)
She started experiencing significant fatigue 4 days ago and is recovering from COVID-19.  She states that her other symptoms of COVID have gone away.  Discussed that COVID can cause long-term fatigue after illness.  Encouraged her to rest, get plenty of sleep every night and to slowly increase her activity.  We will check a CMP, CBC, and TSH today.  Follow-up if her symptoms worsen or do not improve.

## 2022-05-31 NOTE — Telephone Encounter (Addendum)
I spoke with pt;pt lives in [place] and did not feel like driving to [place] due to fatigue and feeling really tired so pt already cancelled appt with Dr Lorelei Pont today. Pt already has appt to go to [facility] 05/31/22 at 4:20 to see Vance Peper DNP. Sending note to Dr Diona Browner and Butch Penny CMA as Juluis Rainier and Barth Kirks DNP.

## 2022-05-31 NOTE — Telephone Encounter (Signed)
Noted  

## 2022-06-01 ENCOUNTER — Telehealth: Payer: Self-pay | Admitting: Family Medicine

## 2022-06-01 LAB — CBC WITH DIFFERENTIAL/PLATELET
Basophils Absolute: 0 10*3/uL (ref 0.0–0.1)
Basophils Relative: 0.7 % (ref 0.0–3.0)
Eosinophils Absolute: 0 10*3/uL (ref 0.0–0.7)
Eosinophils Relative: 0.5 % (ref 0.0–5.0)
HCT: 37.7 % (ref 36.0–46.0)
Hemoglobin: 12.8 g/dL (ref 12.0–15.0)
Lymphocytes Relative: 45.7 % (ref 12.0–46.0)
Lymphs Abs: 2.6 10*3/uL (ref 0.7–4.0)
MCHC: 33.9 g/dL (ref 30.0–36.0)
MCV: 89.4 fl (ref 78.0–100.0)
Monocytes Absolute: 0.4 10*3/uL (ref 0.1–1.0)
Monocytes Relative: 6.7 % (ref 3.0–12.0)
Neutro Abs: 2.7 10*3/uL (ref 1.4–7.7)
Neutrophils Relative %: 46.4 % (ref 43.0–77.0)
Platelets: 307 10*3/uL (ref 150.0–400.0)
RBC: 4.22 Mil/uL (ref 3.87–5.11)
RDW: 13.9 % (ref 11.5–15.5)
WBC: 5.8 10*3/uL (ref 4.0–10.5)

## 2022-06-01 LAB — COMPREHENSIVE METABOLIC PANEL
ALT: 12 U/L (ref 0–35)
AST: 19 U/L (ref 0–37)
Albumin: 4.6 g/dL (ref 3.5–5.2)
Alkaline Phosphatase: 71 U/L (ref 39–117)
BUN: 15 mg/dL (ref 6–23)
CO2: 26 mEq/L (ref 19–32)
Calcium: 9.9 mg/dL (ref 8.4–10.5)
Chloride: 100 mEq/L (ref 96–112)
Creatinine, Ser: 1.04 mg/dL (ref 0.40–1.20)
GFR: 58.38 mL/min — ABNORMAL LOW (ref >60.00)
Glucose, Bld: 90 mg/dL (ref 70–99)
Potassium: 4.4 mEq/L (ref 3.5–5.1)
Sodium: 139 mEq/L (ref 135–145)
Total Bilirubin: 0.4 mg/dL (ref 0.2–1.2)
Total Protein: 7.9 g/dL (ref 6.0–8.3)

## 2022-06-01 LAB — TSH: TSH: 1.41 u[IU]/mL (ref 0.35–5.50)

## 2022-06-01 NOTE — Telephone Encounter (Signed)
Patient called in stating that she was seen at the [facility] location on yesterday 8/27 when Dr Diona Browner was out of office, and she isn't happy about her experience or the feedback that she received from the NP that she seen over there. She still has a lot of questions,and would also like to discuss her blood work that she received there also. She would like a phone call from [place] .

## 2022-06-01 NOTE — Telephone Encounter (Signed)
Called patient and answered questions.  She is having significant continued fatigue, some shortness of breath with exertion, no chest pain with exertion, no palpitations or peripheral edema.  I encouraged her to increase water given decreased GFR, aggressively rest and give symptoms more time to resolve.  We discussed return precautions as well as ER precautions.

## 2022-06-05 ENCOUNTER — Telehealth: Payer: Self-pay | Admitting: Family Medicine

## 2022-06-05 NOTE — Telephone Encounter (Signed)
Pt called requesting to speak with bedsole about her fatigue, feeling light headed & stomach discomfort, filling full with fluid. Pt stated she had spoken to bedsole about the issue before. Callback # for pt is [phone].

## 2022-06-05 NOTE — Telephone Encounter (Signed)
error 

## 2022-06-05 NOTE — Telephone Encounter (Signed)
[name] [name],  Please call patient on Wednesday to get a record of her current concerns.  Of note, if she is not continuing to improve with COVID at this point I do think she needs to come in for an in office exam of her lungs and heart.

## 2022-06-06 ENCOUNTER — Ambulatory Visit (INDEPENDENT_AMBULATORY_CARE_PROVIDER_SITE_OTHER): Payer: 59 | Admitting: Family Medicine

## 2022-06-06 ENCOUNTER — Encounter: Payer: Self-pay | Admitting: Family Medicine

## 2022-06-06 ENCOUNTER — Ambulatory Visit (INDEPENDENT_AMBULATORY_CARE_PROVIDER_SITE_OTHER)
Admission: RE | Admit: 2022-06-06 | Discharge: 2022-06-06 | Disposition: A | Discharge status: Home / Self Care | Payer: 59 | Source: Ambulatory Visit | Attending: Family Medicine | Admitting: Family Medicine

## 2022-06-06 VITALS — BP 90/62 | HR 61 | Temp 97.9°F | Ht 64.75 in | Wt 150.1 lb

## 2022-06-06 DIAGNOSIS — U071 COVID-19: Secondary | ICD-10-CM | POA: Diagnosis not present

## 2022-06-06 DIAGNOSIS — R051 Acute cough: Secondary | ICD-10-CM

## 2022-06-06 DIAGNOSIS — Z8249 Family history of ischemic heart disease and other diseases of the circulatory system: Secondary | ICD-10-CM | POA: Diagnosis not present

## 2022-06-06 DIAGNOSIS — R5383 Other fatigue: Secondary | ICD-10-CM

## 2022-06-06 MED ORDER — IBUPROFEN 800 MG PO TABS: 800.0000 mg | ORAL_TABLET | Freq: Three times a day (TID) | ORAL | 0 refills | Status: DC | PRN

## 2022-06-06 NOTE — Progress Notes (Unsigned)
    [name] T. Jeffrey Graefe, MD, [facility] [place], [address]  Phone: [phone]  FAX: [place] - 61 y.o. female  MRN [id number]  Date of Birth: Aug 14, 1961  Date: 06/06/2022  PCP: Jinny Sanders, MD  Referral: Jinny Sanders, MD  Chief Complaint  Patient presents with   Post Covid    Symptoms started 05/15/22-Positive test the following Monday   Constipation   Fatigue        Dizziness   Subjective:   [name] [name] is a 61 y.o. very pleasant female patient with Body mass index is 25.18 kg/m. who presents with the following:  Took some detox tea.    Feeling tired and having some difficulty doing things   Sister died in [place] this past year.   Sick with fever and in the bed  One week after onset of symptoms, then felt better, and had a resurgence of symptoms.   BP Readings from Last 3 Encounters:  06/06/22 90/62  05/31/22 96/63  05/24/22 120/69      Review of Systems is noted in the HPI, as appropriate  Objective:   BP 90/62   Pulse 61   Temp 97.9 F (36.6 C) (Oral)   Ht 5' 4.75" (1.645 m)   Wt 150 lb 2 oz (68.1 kg)   LMP 09/04/2014   SpO2 97%   BMI 25.18 kg/m   GEN: No acute distress; alert,appropriate. PULM: Breathing comfortably in no respiratory distress PSYCH: Normally interactive.   Laboratory and Imaging Data:  Assessment and Plan:   ***

## 2022-06-06 NOTE — Telephone Encounter (Signed)
Spoke to patient by telephone and was advised that she feels terrible. Patient stated that she is extremely tired and has a hard time getting out of bed. Patient denies SOB or difficulty breathing. Patient stated that she wants to see someone today if at all possible. Patient scheduled with Dr. Lorelei Pont today 06/06/22 at 2:20 pm. Patient was given ER precautions and she verbalized understanding.

## 2022-06-07 ENCOUNTER — Ambulatory Visit: Payer: 59 | Attending: Cardiology | Admitting: Cardiology

## 2022-06-07 ENCOUNTER — Encounter: Payer: Self-pay | Admitting: Cardiology

## 2022-06-07 VITALS — BP 95/58 | HR 64 | Ht 66.6 in | Wt 147.1 lb

## 2022-06-07 DIAGNOSIS — Z87891 Personal history of nicotine dependence: Secondary | ICD-10-CM

## 2022-06-07 DIAGNOSIS — R0609 Other forms of dyspnea: Secondary | ICD-10-CM

## 2022-06-07 DIAGNOSIS — E782 Mixed hyperlipidemia: Secondary | ICD-10-CM

## 2022-06-07 DIAGNOSIS — U099 Post covid-19 condition, unspecified: Secondary | ICD-10-CM

## 2022-06-07 DIAGNOSIS — G9332 Myalgic encephalomyelitis/chronic fatigue syndrome: Secondary | ICD-10-CM

## 2022-06-07 HISTORY — DX: Personal history of nicotine dependence: Z87.891

## 2022-06-07 HISTORY — DX: Myalgic encephalomyelitis/chronic fatigue syndrome: G93.32

## 2022-06-07 HISTORY — DX: Other forms of dyspnea: R06.09

## 2022-06-07 NOTE — Progress Notes (Signed)
Cardiology Office Note:    Date:  06/07/2022   ID:  [name] [name], DOB 1961/04/29, MRN [id number]  PCP:  Jinny Sanders, MD  Cardiologist:  Jenean Lindau, MD   Referring MD: Owens Loffler, MD    ASSESSMENT:    1. Mixed hyperlipidemia   2. Post-COVID chronic fatigue   3. Dyspnea on exertion   4. Former smoker    PLAN:    In order of problems listed above:  Prevention stressed with the patient.  Importance of compliance with diet medication stressed and she vocalized understanding. Fatigue: This appears to be a post-COVID syndrome.  This is managed by her primary care.  I told her to keep herself well hydrated with extra salt and water in the diet.  Her blood pressure is borderline.  She had blood work done recently. Dyspnea on exertion: We will evaluate her with an exercise stress echo to assess this.  She has family history of coronary artery disease. Cardiac murmur: Echocardiogram will be done to assess murmur heard on auscultation. Mixed dyslipidemia: Lipids were reviewed and discussed with the patient at length.  I will do a calcium scoring CT for risk stratification and intervention for this.  Diet was emphasized. Patient will be seen in follow-up appointment in 6 months or earlier if the patient has any concerns    Medication Adjustments/Labs and Tests Ordered: Current medicines are reviewed at length with the patient today.  Concerns regarding medicines are outlined above.  No orders of the defined types were placed in this encounter.  No orders of the defined types were placed in this encounter.    History of Present Illness:    [name] [name] is a 61 y.o. female who is being seen today for the evaluation of fatigue post-COVID at the request of Copland, Frederico Hamman, MD. patient is a pleasant 61 year old female.  She has significant past medical history of hyperlipidemia.  She denies any history of hypertension or diabetes mellitus.  She is an ex-smoker and  quit 10 years ago.  She tells me earlier this month she had the COVID and subsequently has been very fatigued.  She has no chest pain orthopnea or PND.  She has dyspnea on exertion.  At the time of my evaluation, the patient is alert awake oriented and in no distress.  Past Medical History:  Diagnosis Date   Glaucoma     Past Surgical History:  Procedure Laterality Date   COLONOSCOPY     GANGLION CYST EXCISION  11/2005   POLYPECTOMY      Current Medications: Current Meds  Medication Sig   Biotin 1 MG CAPS Take 1 mg by mouth daily.   Cyanocobalamin (B-12) 5000 MCG SUBL Place under the tongue.   ibuprofen (ADVIL) 800 MG tablet Take 1 tablet (800 mg total) by mouth every 8 (eight) hours as needed.   ondansetron (ZOFRAN) 4 MG tablet Take 1 tablet (4 mg total) by mouth every 8 (eight) hours as needed for nausea or vomiting.   timolol (TIMOPTIC) 0.5 % ophthalmic solution Place 1 drop into the left eye daily.     Allergies:   Augmentin [amoxicillin-pot clavulanate], Doxycycline, and Sulfa antibiotics   Social History   Socioeconomic History   Marital status: Single    Spouse name: Not on file   Number of children: 1   Years of education: Not on file   Highest education level: Not on file  Occupational History   Occupation: Mortgages  Employer: UNEMPLOYED  Tobacco Use   Smoking status: Former    Packs/day: 0.20    Years: 25.00    Total pack years: 5.00    Types: Cigarettes    Quit date: 11/22/2011    Years since quitting: 10.5   Smokeless tobacco: Never   Tobacco comments:    only smokes a few days a week  Substance and Sexual Activity   Alcohol use: Yes    Alcohol/week: 0.0 standard drinks of alcohol    Comment: very, very rarely   Drug use: No   Sexual activity: Never  Other Topics Concern   Not on file  Social History Narrative   Not on file   Social Determinants of Health   Financial Resource Strain: Not on file  Food Insecurity: Not on file  Transportation  Needs: Not on file  Physical Activity: Not on file  Stress: Not on file  Social Connections: Not on file     Family History: The patient's family history includes COPD in her father; Cancer in her mother; Colon cancer in her maternal aunt and mother; Heart disease in her father, sister, and sister; Hypertension in her mother; Stroke in her sister and sister. There is no history of Esophageal cancer, Stomach cancer, or Rectal cancer.  ROS:   Please see the history of present illness.    All other systems reviewed and are negative.  EKGs/Labs/Other Studies Reviewed:    The following studies were reviewed today: EKG reveals sinus rhythm and nonspecific ST-T changes   Recent Labs: 05/31/2022: ALT 12; BUN 15; Creatinine, Ser 1.04; Hemoglobin 12.8; Platelets 307.0; Potassium 4.4; Sodium 139; TSH 1.41  Recent Lipid Panel    Component Value Date/Time   CHOL 239 (H) 02/27/2022 0742   TRIG 123.0 02/27/2022 0742   HDL 57.10 02/27/2022 0742   CHOLHDL 4 02/27/2022 0742   VLDL 24.6 02/27/2022 0742   LDLCALC 157 (H) 02/27/2022 0742   LDLDIRECT 126.6 12/22/2012 0758    Physical Exam:    VS:  BP (!) 95/58   Pulse 64   Ht 5' 6.6" (1.692 m)   Wt 147 lb 1.3 oz (66.7 kg)   LMP 09/04/2014   SpO2 99%   BMI 23.31 kg/m     Wt Readings from Last 3 Encounters:  06/07/22 147 lb 1.3 oz (66.7 kg)  06/06/22 150 lb 2 oz (68.1 kg)  05/31/22 151 lb 12.8 oz (68.9 kg)     GEN: Patient is in no acute distress HEENT: Normal NECK: No JVD; No carotid bruits LYMPHATICS: No lymphadenopathy CARDIAC: S1 S2 regular, 2/6 systolic murmur at the apex. RESPIRATORY:  Clear to auscultation without rales, wheezing or rhonchi  ABDOMEN: Soft, non-tender, non-distended MUSCULOSKELETAL:  No edema; No deformity  SKIN: Warm and dry NEUROLOGIC:  Alert and oriented x 3 PSYCHIATRIC:  Normal affect    Signed, Jenean Lindau, MD  06/07/2022 4:06 PM    [facility]

## 2022-06-07 NOTE — Patient Instructions (Signed)
Medication Instructions:  Your physician recommends that you continue on your current medications as directed. Please refer to the Current Medication list given to you today.  *If you need a refill on your cardiac medications before your next appointment, please call your pharmacy*   Lab Work: None ordered If you have labs (blood work) drawn today and your tests are completely normal, you will receive your results only by: [place] (if you have MyChart) OR A paper copy in the mail If you have any lab test that is abnormal or we need to change your treatment, we will call you to review the results.   Testing/Procedures:      Stress Echocardiogram Information Sheet                                                      Instructions:    1. You may take your morning medications the morning of the test  2. Light breakfast.  3. Dress prepared to exercise.  4. DO NOT use ANY caffeine or tobacco products 3 hours before appointment.  5. Please bring all current prescription medications.   Your physician has requested that you have an echocardiogram. Echocardiography is a painless test that uses sound waves to create images of your heart. It provides your doctor with information about the size and shape of your heart and how well your heart's chambers and valves are working. This procedure takes approximately one hour. There are no restrictions for this procedure.    We will order CT coronary calcium score. It will cost $99.00 and is not covered by insurance.  Please call to schedule.    [facility] [address] [place], [place] [address] [phone]     Follow-Up: At [facility], you and your health needs are our priority.  As part of our continuing mission to provide you with exceptional heart care, we have created designated Provider Care Teams.  These Care Teams include your primary Cardiologist (physician) and Advanced Practice Providers (APPs -   Physician Assistants and Nurse Practitioners) who all work together to provide you with the care you need, when you need it.  We recommend signing up for the patient portal called "MyChart".  Sign up information is provided on this After Visit Summary.  MyChart is used to connect with patients for Virtual Visits (Telemedicine).  Patients are able to view lab/test results, encounter notes, upcoming appointments, etc.  Non-urgent messages can be sent to your provider as well.   To learn more about what you can do with MyChart, go to [email or web address].    Your next appointment:   6 month(s)  The format for your next appointment:   In Person  Provider:   Jyl Heinz, MD   Other Instructions Exercise Stress Echocardiogram An exercise stress echocardiogram is a test to check how well your heart is working. This test uses sound waves and a computer to make pictures of your heart. These pictures will be taken before and after you exercise. For this test, you will walk on a treadmill or ride a bicycle to make your heart beat faster. While you exercise, your heart will be checked with an electrocardiogram (ECG). Your blood pressure will also be checked. You may have this test if: You have chest pain or a heart problem. You  had a heart attack or heart surgery not long ago. You have heart valve problems. You have a condition that causes narrowing of the blood vessels that supply your heart. You have a high risk of heart disease and: You are starting a new exercise program. You need to have a big surgery. Tell a doctor about: Any allergies you have. All medicines you are taking. This includes vitamins, herbs, eye drops, creams, and over-the-counter medicines. Any problems you or family members have had with medicines that make you fall asleep (anesthetic medicines). Any surgeries you have had. Any blood disorders you have. Any medical conditions you have. Whether you are pregnant or may  be pregnant. What are the risks? Generally, this is a safe test. However, problems may occur, including: Chest pain. Feeling dizzy or light-headed. Shortness of breath. Increased or irregular heartbeat. Feeling like you may vomit (nausea) or vomiting. Heart attack. This is very rare. What happens before the test? Medicines Ask your doctor about changing or stopping your normal medicines. This is important if you take diabetes medicines or blood thinners. If you use an inhaler, bring it to the test. General instructions Wear comfortable clothes and walking shoes. Follow instructions from your doctor about what you cannot eat or drink before the test. Do not drink or eat anything that has caffeine in it. Stop having caffeine 24 hours before the test. Do not smoke or use products that contain nicotine or tobacco for 4 hours before the test. If you need help quitting, ask your doctor. What happens during the test?  You will take off your clothes from the waist up and put on a hospital gown. Electrodes or patches will be put on your chest. A blood pressure cuff will be put on your arm. Before you exercise, a computer will make a picture of your heart. To do this: You will lie down and a gel will be put on your chest. A wand will be moved over the gel. Sound waves from the wand will go to the computer to make the picture. Then, you will start to exercise. You may walk on a treadmill or pedal a bicycle. Your blood pressure and heart rhythm will be checked while you exercise. The exercise will get harder or faster. You will exercise until: Your heart reaches a certain level. You are too tired to go on. You cannot go on because of chest pain, weakness, or dizziness. You will lie down right away so another picture of your heart can be taken. The procedure may vary among doctors and hospitals. What can I expect after the test? After your test, it is common to have: Mild soreness. Mild  tiredness. Your heart rate and blood pressure will be checked until they return to your normal levels. You should not have any new symptoms after this test. Follow these instructions at home: If your doctor says that you can, you may: Eat what you normally eat. Do your normal activities. Take over-the-counter and prescription medicines only as told by your doctor. Keep all follow-up visits. It is up to you to get the results of your test. Ask how to get your results when they are ready. Contact a doctor if: You feel dizzy or light-headed. You have a fast or irregular heartbeat. You feel like you may vomit or you vomit. You have a headache. You feel short of breath. Get help right away if: You develop pain or pressure: In your chest. In your jaw or neck. Between your  shoulders. That goes down your left arm. You faint. You have trouble breathing. These symptoms may be an emergency. Get medical help right away. Call your local emergency services (911 in the [place] Do not wait to see if the symptoms will go away. Do not drive yourself to the hospital. Summary This is a test that checks how well your heart is working. Follow instructions about what you cannot eat or drink before the test. Ask your doctor if you should take your normal medicines before the test. Stop having caffeine 24 hours before the test. Do not smoke or use products with nicotine or tobacco in them for 4 hours before the test. During the test, your blood pressure and heart rhythm will be checked while you exercise. This information is not intended to replace advice given to you by your health care provider. Make sure you discuss any questions you have with your health care provider. Document Revised: 06/07/2021 Document Reviewed: 05/17/2020 Elsevier Patient Education  [place].  Echocardiogram An echocardiogram is a test that uses sound waves (ultrasound) to produce images of the heart. Images from an  echocardiogram can provide important information about: Heart size and shape. The size and thickness and movement of your heart's walls. Heart muscle function and strength. Heart valve function or if you have stenosis. Stenosis is when the heart valves are too narrow. If blood is flowing backward through the heart valves (regurgitation). A tumor or infectious growth around the heart valves. Areas of heart muscle that are not working well because of poor blood flow or injury from a heart attack. Aneurysm detection. An aneurysm is a weak or damaged part of an artery wall. The wall bulges out from the normal force of blood pumping through the body. Tell a health care provider about: Any allergies you have. All medicines you are taking, including vitamins, herbs, eye drops, creams, and over-the-counter medicines. Any blood disorders you have. Any surgeries you have had. Any medical conditions you have. Whether you are pregnant or may be pregnant. What are the risks? Generally, this is a safe test. However, problems may occur, including an allergic reaction to dye (contrast) that may be used during the test. What happens before the test? No specific preparation is needed. You may eat and drink normally. What happens during the test?  You will take off your clothes from the waist up and put on a hospital gown. Electrodes or electrocardiogram (ECG)patches may be placed on your chest. The electrodes or patches are then connected to a device that monitors your heart rate and rhythm. You will lie down on a table for an ultrasound exam. A gel will be applied to your chest to help sound waves pass through your skin. A handheld device, called a transducer, will be pressed against your chest and moved over your heart. The transducer produces sound waves that travel to your heart and bounce back (or "echo" back) to the transducer. These sound waves will be captured in real-time and changed into images of  your heart that can be viewed on a video monitor. The images will be recorded on a computer and reviewed by your health care provider. You may be asked to change positions or hold your breath for a short time. This makes it easier to get different views or better views of your heart. In some cases, you may receive contrast through an IV in one of your veins. This can improve the quality of the pictures from your heart.  The procedure may vary among health care providers and hospitals. What can I expect after the test? You may return to your normal, everyday life, including diet, activities, and medicines, unless your health care provider tells you not to do that. Follow these instructions at home: It is up to you to get the results of your test. Ask your health care provider, or the department that is doing the test, when your results will be ready. Keep all follow-up visits. This is important. Summary An echocardiogram is a test that uses sound waves (ultrasound) to produce images of the heart. Images from an echocardiogram can provide important information about the size and shape of your heart, heart muscle function, heart valve function, and other possible heart problems. You do not need to do anything to prepare before this test. You may eat and drink normally. After the echocardiogram is completed, you may return to your normal, everyday life, unless your health care provider tells you not to do that. This information is not intended to replace advice given to you by your health care provider. Make sure you discuss any questions you have with your health care provider. Document Revised: 06/07/2021 Document Reviewed: 05/17/2020 Elsevier Patient Education  [place].   Coronary Calcium Scan A coronary calcium scan is an imaging test used to look for deposits of plaque in the inner lining of the blood vessels of the heart (coronary arteries). Plaque is made up of calcium, protein, and  fatty substances. These deposits of plaque can partly clog and narrow the coronary arteries without producing any symptoms or warning signs. This puts a person at risk for a heart attack. A coronary calcium scan is performed using a computed tomography (CT) scanner machine without using a dye (contrast). This test is recommended for people who are at moderate risk for heart disease. The test can find plaque deposits before symptoms develop. Tell a health care provider about: Any allergies you have. All medicines you are taking, including vitamins, herbs, eye drops, creams, and over-the-counter medicines. Any problems you or family members have had with anesthetic medicines. Any bleeding problems you have. Any surgeries you have had. Any medical conditions you have. Whether you are pregnant or may be pregnant. What are the risks? Generally, this is a safe procedure. However, problems may occur, including: Harm to a pregnant woman and her unborn baby. This test involves the use of radiation. Radiation exposure can be dangerous to a pregnant woman and her unborn baby. If you are pregnant or think you may be pregnant, you should not have this procedure done. A slight increase in the risk of cancer. This is because of the radiation involved in the test. The amount of radiation from one test is similar to the amount of radiation you are naturally exposed to over one year. What happens before the procedure? Ask your health care provider for any specific instructions on how to prepare for this procedure. You may be asked to avoid products that contain caffeine, tobacco, or nicotine for 4 hours before the procedure. What happens during the procedure?  You will undress and remove any jewelry from your neck or chest. You may need to remove hearing aides and dentures. Women may need to remove their bras. You will put on a hospital gown. Sticky electrodes will be placed on your chest. The electrodes will be  connected to an electrocardiogram (ECG) machine to record a tracing of the electrical activity of your heart. You will lie down  on your back on a curved bed that is attached to the [place]. You may be given medicine to slow down your heart rate so that clear pictures can be created. You will be moved into the CT scanner, and the CT scanner will take pictures of your heart. During this time, you will be asked to lie still and hold your breath for 10-20 seconds at a time while each picture of your heart is being taken. The procedure may vary among health care providers and hospitals. What can I expect after the procedure? You can return to your normal activities. It is up to you to get the results of your procedure. Ask your health care provider, or the department that is doing the procedure, when your results will be ready. Summary A coronary calcium scan is an imaging test used to look for deposits of plaque in the inner lining of the blood vessels of the heart. Plaque is made up of calcium, protein, and fatty substances. A coronary calcium scan is performed using a CT scanner machine without contrast. Generally, this is a safe procedure. Tell your health care provider if you are pregnant or may be pregnant. Ask your health care provider for any specific instructions on how to prepare for this procedure. You can return to your normal activities after the scan is done. This information is not intended to replace advice given to you by your health care provider. Make sure you discuss any questions you have with your health care provider. Document Revised: 09/03/2021 Document Reviewed: 09/03/2021 Elsevier Patient Education  [place].

## 2022-06-12 ENCOUNTER — Telehealth: Payer: Self-pay

## 2022-06-12 ENCOUNTER — Encounter: Payer: Self-pay | Admitting: Family Medicine

## 2022-06-12 ENCOUNTER — Ambulatory Visit (HOSPITAL_BASED_OUTPATIENT_CLINIC_OR_DEPARTMENT_OTHER)
Admission: RE | Admit: 2022-06-12 | Discharge: 2022-06-12 | Disposition: A | Discharge status: Home / Self Care | Payer: 59 | Source: Ambulatory Visit | Attending: Cardiology | Admitting: Cardiology

## 2022-06-12 DIAGNOSIS — R0609 Other forms of dyspnea: Secondary | ICD-10-CM

## 2022-06-12 LAB — ECHOCARDIOGRAM COMPLETE
AR max vel: 2.21 cm2
AV Area VTI: 2.1 cm2
AV Area mean vel: 2.09 cm2
AV Mean grad: 4 mmHg
AV Peak grad: 8.3 mmHg
Ao pk vel: 1.44 m/s
Area-P 1/2: 3.17 cm2
S' Lateral: 2.7 cm

## 2022-06-12 NOTE — Telephone Encounter (Signed)
Spoke with pt about message and Dr. Julien Nordmann reply. Pt stated that she was drinking plenty of water and agreed to see her PCP or go to Urgent Care if her symptoms worsen or persists.

## 2022-06-12 NOTE — Progress Notes (Signed)
  Echocardiogram 2D Echocardiogram has been performed.  [name] [name] F 06/12/2022, 2:14 PM

## 2022-06-13 ENCOUNTER — Ambulatory Visit (HOSPITAL_BASED_OUTPATIENT_CLINIC_OR_DEPARTMENT_OTHER)
Admission: RE | Admit: 2022-06-13 | Discharge: 2022-06-13 | Disposition: A | Discharge status: Home / Self Care | Payer: Self-pay | Source: Ambulatory Visit | Attending: Cardiology | Admitting: Cardiology

## 2022-06-13 DIAGNOSIS — Z87891 Personal history of nicotine dependence: Secondary | ICD-10-CM | POA: Insufficient documentation

## 2022-06-13 DIAGNOSIS — E782 Mixed hyperlipidemia: Secondary | ICD-10-CM | POA: Insufficient documentation

## 2022-06-27 ENCOUNTER — Telehealth (HOSPITAL_COMMUNITY): Payer: Self-pay | Admitting: *Deleted

## 2022-06-27 NOTE — Telephone Encounter (Signed)
Patient given detailed instructions per Stress Test Requisition Sheet for test on 07/02/2022 at 2:00.Patient Notified to arrive 30 minutes early, and that it is imperative to arrive on time for appointment to keep from having the test rescheduled.  Patient verbalized understanding. Veronia Beets

## 2022-07-02 ENCOUNTER — Ambulatory Visit (HOSPITAL_COMMUNITY): Payer: 59 | Attending: Cardiology

## 2022-07-02 ENCOUNTER — Ambulatory Visit (HOSPITAL_COMMUNITY): Payer: 59

## 2022-07-02 DIAGNOSIS — R0609 Other forms of dyspnea: Secondary | ICD-10-CM | POA: Diagnosis not present

## 2022-07-02 LAB — ECHOCARDIOGRAM STRESS TEST
Area-P 1/2: 2.72 cm2
S' Lateral: 2.9 cm

## 2022-07-02 MED ORDER — PERFLUTREN LIPID MICROSPHERE
1.0000 mL | INTRAVENOUS | Status: AC | PRN
  Administered 2022-07-02: 3 mL via INTRAVENOUS

## 2022-09-13 ENCOUNTER — Ambulatory Visit (INDEPENDENT_AMBULATORY_CARE_PROVIDER_SITE_OTHER): Payer: 59 | Admitting: Family Medicine

## 2022-09-13 ENCOUNTER — Encounter: Payer: Self-pay | Admitting: Family Medicine

## 2022-09-13 VITALS — BP 124/80 | HR 79 | Temp 98.2°F | Wt 152.0 lb

## 2022-09-13 DIAGNOSIS — R6883 Chills (without fever): Secondary | ICD-10-CM

## 2022-09-13 LAB — POCT INFLUENZA A/B
Influenza A, POC: NEGATIVE
Influenza B, POC: NEGATIVE

## 2022-09-13 LAB — POC COVID19 BINAXNOW: SARS Coronavirus 2 Ag: NEGATIVE

## 2022-09-13 MED ORDER — KETOROLAC TROMETHAMINE 30 MG/ML IJ SOLN
30.0000 mg | Freq: Once | INTRAMUSCULAR | Status: AC
  Administered 2022-09-13: 30 mg via INTRAMUSCULAR

## 2022-09-13 MED ORDER — IBUPROFEN 800 MG PO TABS: 800.0000 mg | ORAL_TABLET | Freq: Three times a day (TID) | ORAL | 0 refills | Status: AC | PRN

## 2022-09-13 NOTE — Patient Instructions (Signed)
Please be sure to drink plenty of fluids.   You may take the following OTC medications to help with symptoms:  For cough, use  cough syrups or other cough suppressants.  For headache, sore throat, fevers, muscle aches, chills, other pain, take ibuprofen or tylenol  For congestion, use nasal sprays, decongestants, or antihistamines  Please follow up if no improvement.   Go to ED if you have severe chest pain, fevers, shortness of breath or other worrisome symptoms.   

## 2022-09-13 NOTE — Progress Notes (Signed)
Assessment/Plan:   Problem List Items Addressed This Visit   None Visit Diagnoses     Chills (without fever)    -  Primary   Relevant Medications   ketorolac (TORADOL) 30 MG/ML injection 30 mg (Completed)   ibuprofen (ADVIL) 800 MG tablet   Other Relevant Orders   POC COVID-19 (Completed)   POCT Influenza A/B (Completed)     Symptoms likely related to unknown viral infection.  Encourage p.o. hydration and NSAIDs.  ED precautions discussed     Subjective:  HPI:  [name] [name] is a 61 y.o. female who has Hyperlipidemia; Mild intermittent asthma; Fatigue; Vitamin D deficiency; Sleep apnea; Upper back pain on right side; Glaucoma; COVID-19; Post-COVID chronic fatigue; Dyspnea on exertion; and Former smoker on their problem list..   She  has a past medical history of Glaucoma..   She presents with chief complaint of Chills (Chills , body aches , x 1 day. Denies runny nose, cough, runny nose. Neg covid test ) .   Chills.  Patient reports that she has had chills and bodyaches for 1 day.  She has been trying ibuprofen without improvement.  She denies any other symptoms including runny nose, cough, shortness of breath, chest pain.  Patient with COVID-19 infection in 05/2022 with prolonged dyspnea.  Sister also died from COVID-5.  She is quite concerned that she may have it.     Past Surgical History:  Procedure Laterality Date   COLONOSCOPY     GANGLION CYST EXCISION  11/2005   POLYPECTOMY      Outpatient Medications Prior to Visit  Medication Sig Dispense Refill   Biotin 1 MG CAPS Take 1 mg by mouth daily.     timolol (TIMOPTIC) 0.5 % ophthalmic solution Place 1 drop into the left eye daily.     ibuprofen (ADVIL) 800 MG tablet Take 1 tablet (800 mg total) by mouth every 8 (eight) hours as needed. 60 tablet 0   Cyanocobalamin (B-12) 5000 MCG SUBL Place under the tongue. (Patient not taking: Reported on 09/13/2022)     ondansetron (ZOFRAN) 4 MG tablet Take 1 tablet (4 mg  total) by mouth every 8 (eight) hours as needed for nausea or vomiting. (Patient not taking: Reported on 09/13/2022) 30 tablet 0   No facility-administered medications prior to visit.    Family History  Problem Relation Age of Onset   Cancer Mother        lung cancer age 68, colon cancer age 19   Hypertension Mother    Colon cancer Mother    Heart disease Father    COPD Father    Stroke Sister    Heart disease Sister    Stroke Sister    Heart disease Sister    Colon cancer Maternal Aunt    Esophageal cancer Neg Hx    Stomach cancer Neg Hx    Rectal cancer Neg Hx     Social History   Socioeconomic History   Marital status: Single    Spouse name: Not on file   Number of children: 1   Years of education: Not on file   Highest education level: Not on file  Occupational History   Occupation: Nature conservation officer: UNEMPLOYED  Tobacco Use   Smoking status: Former    Packs/day: 0.20    Years: 25.00    Total pack years: 5.00    Types: Cigarettes    Quit date: 11/22/2011    Years since quitting: 68.8  Smokeless tobacco: Never   Tobacco comments:    only smokes a few days a week  Substance and Sexual Activity   Alcohol use: Yes    Alcohol/week: 0.0 standard drinks of alcohol    Comment: very, very rarely   Drug use: No   Sexual activity: Never  Other Topics Concern   Not on file  Social History Narrative   Not on file   Social Determinants of Health   Financial Resource Strain: Not on file  Food Insecurity: Not on file  Transportation Needs: Not on file  Physical Activity: Not on file  Stress: Not on file  Social Connections: Not on file  Intimate Partner Violence: Not on file                                                                                                 Objective:  Physical Exam: BP 124/80 (BP Location: Right Arm, Patient Position: Sitting, Cuff Size: Large)   Pulse 79   Temp 98.2 F (36.8 C) (Oral)   Wt 152 lb (68.9 kg)   LMP  09/04/2014   SpO2 99%   BMI 24.09 kg/m    General: Appears uncomfortable, but no distress awake and conversant.  Eyes: Normal conjunctiva, anicteric. Round symmetric pupils.  ENT: Hearing grossly intact. No nasal discharge.  Neck: Neck is supple. No masses or thyromegaly.  Respiratory: Respirations are non-labored.  CTAB ABD: Nontender nondistended Skin: Warm. No rashes or ulcers.  Psych: Alert and oriented. Cooperative, Appropriate mood and affect, Normal judgment.  CV: No cyanosis or JVD, RRR, no MRG MSK: Normal ambulation. No clubbing  Neuro: Sensation and CN II-XII grossly normal.   Results for orders placed or performed in visit on 09/13/22  POC COVID-19  Result Value Ref Range   SARS Coronavirus 2 Ag Negative Negative  POCT Influenza A/B  Result Value Ref Range   Influenza A, POC Negative Negative   Influenza B, POC Negative Negative        Alesia Banda, MD, MS

## 2022-09-14 ENCOUNTER — Telehealth: Payer: 59 | Admitting: Family Medicine

## 2022-09-19 ENCOUNTER — Ambulatory Visit (INDEPENDENT_AMBULATORY_CARE_PROVIDER_SITE_OTHER): Payer: 59 | Admitting: Family Medicine

## 2022-09-19 ENCOUNTER — Encounter: Payer: Self-pay | Admitting: Family Medicine

## 2022-09-19 VITALS — BP 102/78 | HR 75 | Temp 97.8°F | Wt 153.4 lb

## 2022-09-19 DIAGNOSIS — R350 Frequency of micturition: Secondary | ICD-10-CM

## 2022-09-19 DIAGNOSIS — R5383 Other fatigue: Secondary | ICD-10-CM

## 2022-09-19 DIAGNOSIS — G44209 Tension-type headache, unspecified, not intractable: Secondary | ICD-10-CM

## 2022-09-19 HISTORY — DX: Frequency of micturition: R35.0

## 2022-09-19 LAB — POCT URINALYSIS DIPSTICK
Bilirubin, UA: NEGATIVE
Blood, UA: NEGATIVE
Glucose, UA: NEGATIVE
Ketones, UA: NEGATIVE
Leukocytes, UA: NEGATIVE
Nitrite, UA: NEGATIVE
Protein, UA: NEGATIVE
Spec Grav, UA: 1.02 (ref 1.010–1.025)
Urobilinogen, UA: 0.2 E.U./dL
pH, UA: 6 (ref 5.0–8.0)

## 2022-09-19 LAB — COMPREHENSIVE METABOLIC PANEL
ALT: 30 U/L (ref 0–35)
AST: 31 U/L (ref 0–37)
Albumin: 4.4 g/dL (ref 3.5–5.2)
Alkaline Phosphatase: 78 U/L (ref 39–117)
BUN: 13 mg/dL (ref 6–23)
CO2: 29 mEq/L (ref 19–32)
Calcium: 9.5 mg/dL (ref 8.4–10.5)
Chloride: 104 mEq/L (ref 96–112)
Creatinine, Ser: 0.79 mg/dL (ref 0.40–1.20)
GFR: 81.02 mL/min (ref >60.00)
Glucose, Bld: 86 mg/dL (ref 70–99)
Potassium: 3.8 mEq/L (ref 3.5–5.1)
Sodium: 140 mEq/L (ref 135–145)
Total Bilirubin: 0.6 mg/dL (ref 0.2–1.2)
Total Protein: 7.3 g/dL (ref 6.0–8.3)

## 2022-09-19 LAB — CBC WITH DIFFERENTIAL/PLATELET
Basophils Absolute: 0 10*3/uL (ref 0.0–0.1)
Basophils Relative: 0.8 % (ref 0.0–3.0)
Eosinophils Absolute: 0 10*3/uL (ref 0.0–0.7)
Eosinophils Relative: 0.7 % (ref 0.0–5.0)
HCT: 36.7 % (ref 36.0–46.0)
Hemoglobin: 12.1 g/dL (ref 12.0–15.0)
Lymphocytes Relative: 55.4 % — ABNORMAL HIGH (ref 12.0–46.0)
Lymphs Abs: 2.8 10*3/uL (ref 0.7–4.0)
MCHC: 33.1 g/dL (ref 30.0–36.0)
MCV: 88.1 fl (ref 78.0–100.0)
Monocytes Absolute: 0.3 10*3/uL (ref 0.1–1.0)
Monocytes Relative: 5.7 % (ref 3.0–12.0)
Neutro Abs: 1.9 10*3/uL (ref 1.4–7.7)
Neutrophils Relative %: 37.4 % — ABNORMAL LOW (ref 43.0–77.0)
Platelets: 279 10*3/uL (ref 150.0–400.0)
RBC: 4.16 Mil/uL (ref 3.87–5.11)
RDW: 13.2 % (ref 11.5–15.5)
WBC: 5 10*3/uL (ref 4.0–10.5)

## 2022-09-19 MED ORDER — CEPHALEXIN 500 MG PO CAPS: 500.0000 mg | ORAL_CAPSULE | Freq: Two times a day (BID) | ORAL | 0 refills | Status: AC

## 2022-09-19 MED ORDER — NAPROXEN 500 MG PO TABS: 500.0000 mg | ORAL_TABLET | Freq: Two times a day (BID) | ORAL | 0 refills | Status: DC

## 2022-09-19 NOTE — Assessment & Plan Note (Addendum)
Patient follow-up on ongoing chills and myalgias, now with frequent urination, fatigue and headache UA not indicative of UTI Headache is in tension type fashion without red flag symptoms Normal vital signs and physical exam Symptoms possibly related to complicated UTI, however, presentation general supportive of generalized infection versus inflammatory process, but otherwise not very specific to other processes Treat empirically for UTI CMP, CBC, urine culture to workup for underlying illness, with further testing based on worsening symptomatology and/or lab abnormalities at the present

## 2022-09-19 NOTE — Progress Notes (Signed)
Assessment/Plan:   Problem List Items Addressed This Visit       Other   Fatigue   Frequent urination - Primary    Patient follow-up on ongoing chills and myalgias, now with frequent urination, fatigue and headache UA not indicative of UTI Headache is in tension type fashion without red flag symptoms Normal vital signs and physical exam Symptoms possibly related to complicated UTI, however, presentation general supportive of generalized infection versus inflammatory process, but otherwise not very specific to other processes Treat empirically for UTI CMP, CBC, urine culture to workup for underlying illness, with further testing based on worsening symptomatology and/or lab abnormalities at the present      Relevant Medications   cephALEXin (KEFLEX) 500 MG capsule   Other Relevant Orders   POCT Urinalysis Dipstick (Completed)   CBC w/Diff   Comp Met (CMET)   Urine Culture   Other Visit Diagnoses     Tension-type headache, not intractable, unspecified chronicity pattern       Relevant Medications   naproxen (NAPROSYN) 500 MG tablet   Other Relevant Orders   CBC w/Diff   Comp Met (CMET)   Urine Culture          Subjective:  HPI:  [name] [name] is a 61 y.o. female who has Hyperlipidemia; Mild intermittent asthma; Fatigue; Vitamin D deficiency; Sleep apnea; Upper back pain on right side; Glaucoma; COVID-19; Post-COVID chronic fatigue; Dyspnea on exertion; Former smoker; and Frequent urination on their problem list..   She  has a past medical history of Glaucoma..   She presents with chief complaint of headache (Headache, fatigue, frequent urination x ongoing from previous visit 09/13/2022.) .   Headache and frequent urination 09/19/22 Patient presents for follow-up for ongoing symptoms.  At last visit, patient had had 1 day of chills and myalgias.  Patient tested negative for COVID/flu.    Since last visit, patient has also developed frequent urination,  fatigue, leg cramps, and headache.  Symptoms are staying constant.  She reports that the headache is entire head in a bandlike distribution and describes it as "feeling it in the muscle".  She has been taking ibuprofen for headache, however this did not help. Instead she took 2 OTC naproxen tablets this a.m.  And did have improvement in headache. She is tolerating p.o. fluids and liquids, but does report decreased appetite.   Patient denies eye pain, dysuria, hematuria, abdominal pain, nausea, vomiting, chest pain, shortness of breath, fevers, palpitations, vision changes, weaknesses, difficulty swallowing.  She denies any new medications or over-the-counter supplements.  Last OV 09/13/2022 Patient reports that she has had chills and bodyaches for 1 day.  She has been trying ibuprofen without improvement.  She denies any other symptoms including runny nose, cough, shortness of breath, chest pain.  Patient with COVID-19 infection in 05/2022 with prolonged dyspnea.  Sister also died from COVID-66.  She is quite concerned that she may have it.         Past Surgical History:  Procedure Laterality Date   COLONOSCOPY     GANGLION CYST EXCISION  11/2005   POLYPECTOMY      Outpatient Medications Prior to Visit  Medication Sig Dispense Refill   Biotin 1 MG CAPS Take 1 mg by mouth daily.     ibuprofen (ADVIL) 800 MG tablet Take 1 tablet (800 mg total) by mouth every 8 (eight) hours as needed. 60 tablet 0   timolol (TIMOPTIC) 0.5 % ophthalmic solution Place 1 drop into  the left eye daily.     Cyanocobalamin (B-12) 5000 MCG SUBL Place under the tongue. (Patient not taking: Reported on 09/13/2022)     ondansetron (ZOFRAN) 4 MG tablet Take 1 tablet (4 mg total) by mouth every 8 (eight) hours as needed for nausea or vomiting. (Patient not taking: Reported on 09/13/2022) 30 tablet 0   No facility-administered medications prior to visit.    Family History  Problem Relation Age of Onset   Cancer Mother         lung cancer age 76, colon cancer age 81   Hypertension Mother    Colon cancer Mother    Heart disease Father    COPD Father    Stroke Sister    Heart disease Sister    Stroke Sister    Heart disease Sister    Colon cancer Maternal Aunt    Esophageal cancer Neg Hx    Stomach cancer Neg Hx    Rectal cancer Neg Hx     Social History   Socioeconomic History   Marital status: Single    Spouse name: Not on file   Number of children: 1   Years of education: Not on file   Highest education level: Not on file  Occupational History   Occupation: Nature conservation officer: UNEMPLOYED  Tobacco Use   Smoking status: Former    Packs/day: 0.20    Years: 25.00    Total pack years: 5.00    Types: Cigarettes    Quit date: 11/22/2011    Years since quitting: 10.8   Smokeless tobacco: Never   Tobacco comments:    only smokes a few days a week  Substance and Sexual Activity   Alcohol use: Yes    Alcohol/week: 0.0 standard drinks of alcohol    Comment: very, very rarely   Drug use: No   Sexual activity: Never  Other Topics Concern   Not on file  Social History Narrative   Not on file   Social Determinants of Health   Financial Resource Strain: Not on file  Food Insecurity: Not on file  Transportation Needs: Not on file  Physical Activity: Not on file  Stress: Not on file  Social Connections: Not on file  Intimate Partner Violence: Not on file                                                                                                 Objective:  Physical Exam: BP 102/78 (BP Location: Left Arm, Patient Position: Sitting, Cuff Size: Large)   Pulse 75   Temp 97.8 F (36.6 C) (Temporal)   Wt 153 lb 6.4 oz (69.6 kg)   LMP 09/04/2014   SpO2 98%   BMI 24.32 kg/m    General: Appears mildly uncomfortable, but no distress awake and conversant.  Eyes: Normal conjunctiva, anicteric.  PERRLA, EOMI ENT: Hearing grossly intact. No nasal discharge.  Tympanic membranes pearly  normal, otic canals without erythema, no auricular tenderness, no oropharyngeal lesions, moist mucous membranes, mobile tongue, no nasal polyps observed, Neck: Neck is supple.  No lymphadenopathy respiratory: Respirations  are non-labored.  CTAB ABD: Nontender nondistended GU: No CVA tenderness Skin: Warm. No rashes or ulcers.  Psych: Alert and oriented. Cooperative, Appropriate mood and affect, Normal judgment.  CV: No cyanosis or JVD, RRR, no MRG MSK: Normal ambulation. No clubbing  Neuro: Sensation and CN II-XII grossly normal.   Results for orders placed or performed in visit on 09/19/22  POCT Urinalysis Dipstick  Result Value Ref Range   Color, UA yellow    Clarity, UA clear    Glucose, UA Negative Negative   Bilirubin, UA neg    Ketones, UA neg    Spec Grav, UA 1.020 1.010 - 1.025   Blood, UA neg    pH, UA 6.0 5.0 - 8.0   Protein, UA Negative Negative   Urobilinogen, UA 0.2 0.2 or 1.0 E.U./dL   Nitrite, UA neg    Leukocytes, UA Negative Negative   Appearance clear    Odor      Alesia Banda, MD, MS

## 2022-09-19 NOTE — Patient Instructions (Signed)
We are checking urine, basic blood work.  We are treating for UTI with cephalexin, but if the urine culture is negative, we can stop and biotics  He has naproxen as needed for aches and pains.

## 2022-09-20 LAB — URINE CULTURE
MICRO NUMBER:: 14310030
Result:: NO GROWTH
SPECIMEN QUALITY:: ADEQUATE

## 2022-09-25 ENCOUNTER — Telehealth: Payer: Self-pay | Admitting: Family Medicine

## 2022-09-25 MED ORDER — SCOPOLAMINE 1 MG/3DAYS TD PT72: 1.0000 | MEDICATED_PATCH | TRANSDERMAL | 12 refills | Status: DC

## 2022-09-25 NOTE — Telephone Encounter (Signed)
Please call patient Given she is going on a cruise does she mean scopolamine patches for motion sickness?  Or something else .

## 2022-09-25 NOTE — Telephone Encounter (Signed)
Spoke with patient.  She is talking about the scopolamine patch.  She is going on a 7 day cruise.

## 2022-09-25 NOTE — Telephone Encounter (Signed)
Please see request below for nausea medicine for cruise.  Patient states she is going out of town tomorrow, but leaving for the cruise on Friday.

## 2022-09-25 NOTE — Telephone Encounter (Signed)
Pt is wanting a prescription for nausea, she is going on a cruise this Friday. I let her know Dr Grandville Silos is not her PCP and she should call her. She made the statement she had seen Dr Grandville Silos twice in the past week.   [id number] - [place], [place] [address], [place] [address] Phone: [phone]  Fax: [phone] DEA #: [id number]   Pt's @ [phone]

## 2022-09-25 NOTE — Telephone Encounter (Signed)
Advised patient of annotation below. She became irate and stated that she would contact her pcp.

## 2022-09-25 NOTE — Addendum Note (Signed)
Addended by: Eliezer Lofts E on: 09/25/2022 05:27 PM   Modules accepted: Orders

## 2022-09-25 NOTE — Telephone Encounter (Signed)
Prescription sent in  

## 2022-10-16 ENCOUNTER — Other Ambulatory Visit: Payer: Self-pay | Admitting: Family Medicine

## 2022-10-16 DIAGNOSIS — G44209 Tension-type headache, unspecified, not intractable: Secondary | ICD-10-CM

## 2022-12-06 ENCOUNTER — Ambulatory Visit: Payer: 59 | Admitting: Cardiology

## 2023-03-27 ENCOUNTER — Encounter: Payer: Self-pay | Admitting: Family Medicine

## 2023-03-28 ENCOUNTER — Encounter: Payer: Self-pay | Admitting: Internal Medicine

## 2023-03-28 ENCOUNTER — Ambulatory Visit (INDEPENDENT_AMBULATORY_CARE_PROVIDER_SITE_OTHER): Payer: 59 | Admitting: Internal Medicine

## 2023-03-28 VITALS — BP 100/60 | HR 64 | Temp 97.1°F | Ht 65.0 in | Wt 158.0 lb

## 2023-03-28 DIAGNOSIS — R600 Localized edema: Secondary | ICD-10-CM | POA: Diagnosis not present

## 2023-03-28 MED ORDER — MECLIZINE HCL 25 MG PO TABS: 25.0000 mg | ORAL_TABLET | Freq: Three times a day (TID) | ORAL | 0 refills | Status: DC | PRN

## 2023-03-28 NOTE — Progress Notes (Signed)
Subjective:    Patient ID: [name] [name], female    DOB: 07-05-1961, 62 y.o.   MRN: [id number]  HPI Here due to ankle swelling  About a month ago---has started walking daily for an hour Has done well with this Then she added weights to her ankles----but stopped it 2 weeks ago---awoke and ankles started swelling (while walking) Since then, swell during the day---then gone in the morning Left more than right  Shows picture from yesterday--already improving but only shows mild swelling No pain No action  Did try naproxen--didn't notice any difference  Current Outpatient Medications on File Prior to Visit  Medication Sig Dispense Refill   ibuprofen (ADVIL) 800 MG tablet Take 1 tablet (800 mg total) by mouth every 8 (eight) hours as needed. 60 tablet 0   OVER THE COUNTER MEDICATION Viviscal: Hair Vitamin     timolol (TIMOPTIC) 0.5 % ophthalmic solution Place 1 drop into the left eye daily.     naproxen (NAPROSYN) 500 MG tablet Take 1 tablet (500 mg total) by mouth 2 (two) times daily with a meal. (Patient not taking: Reported on 03/28/2023) 30 tablet 0   No current facility-administered medications on file prior to visit.    Allergies  Allergen Reactions   Augmentin [Amoxicillin-Pot Clavulanate] Nausea And Vomiting   Doxycycline    Sulfa Antibiotics     Past Medical History:  Diagnosis Date   COVID-19 05/27/2022   Dyspnea on exertion 06/07/2022   Fatigue 02/09/2014   Former smoker 06/07/2022   Frequent urination 09/19/2022   Glaucoma    Hyperlipidemia 10/24/2007   Qualifier: Diagnosis of   By: Etheleen Mayhew CMA (AAMA), Lugene       Mild intermittent asthma 11/23/2010   Annotation: mild, intermittent  Qualifier: Diagnosis of   By: Ermalene Searing MD, Amy       Post-COVID chronic fatigue 06/07/2022   Sleep apnea 04/25/2018   Upper back pain on right side 09/12/2020   Vitamin D deficiency 02/28/2016    Past Surgical History:  Procedure Laterality Date   COLONOSCOPY      GANGLION CYST EXCISION  11/2005   POLYPECTOMY      Family History  Problem Relation Age of Onset   Cancer Mother        lung cancer age 53, colon cancer age 55   Hypertension Mother    Colon cancer Mother    Heart disease Father    COPD Father    Stroke Sister    Heart disease Sister    Stroke Sister    Heart disease Sister    Colon cancer Maternal Aunt    Esophageal cancer Neg Hx    Stomach cancer Neg Hx    Rectal cancer Neg Hx     Social History   Socioeconomic History   Marital status: Single    Spouse name: Not on file   Number of children: 1   Years of education: Not on file   Highest education level: Not on file  Occupational History   Occupation: Database administrator: UNEMPLOYED  Tobacco Use   Smoking status: Former    Packs/day: 0.20    Years: 25.00    Additional pack years: 0.00    Total pack years: 5.00    Types: Cigarettes    Quit date: 11/22/2011    Years since quitting: 11.3   Smokeless tobacco: Never   Tobacco comments:    only smokes a few days a week  Substance and Sexual Activity  Alcohol use: Yes    Alcohol/week: 0.0 standard drinks of alcohol    Comment: very, very rarely   Drug use: No   Sexual activity: Never  Other Topics Concern   Not on file  Social History Narrative   Not on file   Social Determinants of Health   Financial Resource Strain: Not on file  Food Insecurity: Not on file  Transportation Needs: Not on file  Physical Activity: Not on file  Stress: Not on file  Social Connections: Not on file  Intimate Partner Violence: Not on file   Review of Systems Has been putting salt on her watermelon lately (nothing new) No DOE No chest pain No injury to legs--but did see a scab on the back of her left ankle Travel to [facility] before this started----a month ago    Objective:   Physical Exam Constitutional:      Appearance: Normal appearance.  Cardiovascular:     Rate and Rhythm: Normal rate and regular rhythm.      Heart sounds: No murmur heard.    No gallop.  Pulmonary:     Effort: Pulmonary effort is normal.     Breath sounds: Normal breath sounds. No wheezing or rales.  Musculoskeletal:     Cervical back: Neck supple.     Right lower leg: No edema.     Left lower leg: No edema.  Lymphadenopathy:     Cervical: No cervical adenopathy.  Neurological:     Mental Status: She is alert.            Assessment & Plan:

## 2023-03-28 NOTE — Patient Instructions (Signed)
Chronic Venous Insufficiency Chronic venous insufficiency is a condition that causes the veins in the legs to struggle to pump blood from the legs to the heart. It is also called venous stasis. This condition can happen when the vein walls are stretched, weakened, or damaged. It can also happen when the valves inside the vein are damaged. With the right treatment, you should be able to still lead an active life. What are the causes? Common causes of this condition include: Venous hypertension. This is high blood pressure inside the veins. Sitting or standing too long. This can cause increased blood pressure in the veins of the leg. Deep vein thrombosis (DVT). This is a blood clot that blocks blood flow in a vein. Phlebitis. This is inflammation of a vein. It can cause a blood clot to form. An abnormal growth of cells (tumor) in the area between your hip bones (pelvis). This can cause blood to back up. What increases the risk? Factors that may make you more likely to get this condition include: Having a family history of the condition. Being overweight. Being pregnant. Not getting enough exercise. Smoking. Having a job that requires you to sit or stand in one place for a long time. Being a certain age. Females in their 40s and 50s and males in their 70s are more likely to get this condition. What are the signs or symptoms? Symptoms of this condition include: Varicose veins. These are veins that are enlarged, bulging, or twisted. Skin breakdown or ulcers. Reddened skin or dark discoloration of the skin on the leg between the knee and ankle. Lipodermatosclerosis. This is brown, smooth, tight, and painful skin just above the ankle. It is often on the inside of the leg. Swelling of the legs. How is this diagnosed? This condition may be diagnosed based on your medical history and a physical exam. You may also need tests, such as: A duplex ultrasound. This shows how blood flows through a blood  vessel. Plethysmography. This tests blood flow. Venogram. This looks at the veins using an X-ray and dye. How is this treated? The goals of treatment are to help you return to an active life and to relieve pain. Treatment may include: Wearing compression stockings. These do not cure the condition but can help relieve symptoms. They can also help stop your condition from getting worse. Sclerotherapy. This involves injecting a solution to shrink damaged veins. Surgery. This may include: Vein stripping. This is when a diseased vein is taken out. Laser ablation surgery. This is when blood flow is cut off through the vein. Repairing or remaking a valve inside the affected vein. Follow these instructions at home: Lifestyle Do not use any products that contain nicotine or tobacco. These products include cigarettes, chewing tobacco, and vaping devices, such as e-cigarettes. If you need help quitting, ask your health care provider. Stay active. Exercise, walk, or do other activities. Ask your provider what activities are safe for you. General instructions Take over-the-counter and prescription medicines only as told by your provider. Drink enough fluid to keep your pee (urine) pale yellow. Wear compression stockings as told by your provider. These stockings help to prevent blood clots and reduce swelling in your legs. Keep all follow-up visits. Your provider will check your legs for any changes and adjust your treatment plan as needed. Contact a health care provider if: You have redness, swelling, or more pain in the affected area. You see a red streak or line that goes up or down from the   area. You have skin breakdown or skin loss. You get an injury in the affected area. You get a fever. Get help right away if: You have severe pain that does not get better with medicine. You get an injury and an open wound in the affected area. Your foot or ankle becomes numb or weak all of a sudden. You have  trouble moving your foot or ankle. Your symptoms do not go away or get worse. You have chest pain. You have shortness of breath. These symptoms may be an emergency. Get help right away. Call 911. Do not wait to see if the symptoms will go away. Do not drive yourself to the hospital. This information is not intended to replace advice given to you by your health care provider. Make sure you discuss any questions you have with your health care provider. Document Revised: 10/09/2022 Document Reviewed: 10/09/2022 Elsevier Patient Education  2024 [employer]  

## 2023-03-28 NOTE — Assessment & Plan Note (Signed)
Recent kidney/liver tests normal Echo (stress) had normal EF Clearly seems to be venous insufficiency Discussed salt restriction Elevation/support socks reassured

## 2023-04-10 ENCOUNTER — Encounter: Payer: Self-pay | Admitting: Family Medicine

## 2023-04-10 NOTE — Telephone Encounter (Signed)
Patient contacted the office regarding this message, patient is wanting to know what Dr. Ermalene Searing would suggest for her to do. Does she need tobe seen for another OV to evaluate? Please advise, thank you

## 2023-04-10 NOTE — Telephone Encounter (Signed)
Patient scheduled and notified of [employer]'s message

## 2023-04-12 ENCOUNTER — Encounter: Payer: Self-pay | Admitting: Family Medicine

## 2023-04-12 ENCOUNTER — Ambulatory Visit (INDEPENDENT_AMBULATORY_CARE_PROVIDER_SITE_OTHER): Payer: 59 | Admitting: Family Medicine

## 2023-04-12 VITALS — BP 90/60 | HR 57 | Temp 98.4°F | Ht 65.0 in | Wt 157.2 lb

## 2023-04-12 DIAGNOSIS — R600 Localized edema: Secondary | ICD-10-CM

## 2023-04-12 LAB — CBC WITH DIFFERENTIAL/PLATELET
Basophils Absolute: 0 10*3/uL (ref 0.0–0.1)
Basophils Relative: 0.6 % (ref 0.0–3.0)
Eosinophils Absolute: 0 10*3/uL (ref 0.0–0.7)
Eosinophils Relative: 0.8 % (ref 0.0–5.0)
HCT: 38.5 % (ref 36.0–46.0)
Hemoglobin: 12.5 g/dL (ref 12.0–15.0)
Lymphocytes Relative: 52 % — ABNORMAL HIGH (ref 12.0–46.0)
Lymphs Abs: 2.5 10*3/uL (ref 0.7–4.0)
MCHC: 32.5 g/dL (ref 30.0–36.0)
MCV: 89.1 fl (ref 78.0–100.0)
Monocytes Absolute: 0.3 10*3/uL (ref 0.1–1.0)
Monocytes Relative: 6.4 % (ref 3.0–12.0)
Neutro Abs: 1.9 10*3/uL (ref 1.4–7.7)
Neutrophils Relative %: 40.2 % — ABNORMAL LOW (ref 43.0–77.0)
Platelets: 290 10*3/uL (ref 150.0–400.0)
RBC: 4.32 Mil/uL (ref 3.87–5.11)
RDW: 14.3 % (ref 11.5–15.5)
WBC: 4.8 10*3/uL (ref 4.0–10.5)

## 2023-04-12 LAB — COMPREHENSIVE METABOLIC PANEL
ALT: 18 U/L (ref 0–35)
AST: 24 U/L (ref 0–37)
Albumin: 4.5 g/dL (ref 3.5–5.2)
Alkaline Phosphatase: 80 U/L (ref 39–117)
BUN: 11 mg/dL (ref 6–23)
CO2: 26 mEq/L (ref 19–32)
Calcium: 10.1 mg/dL (ref 8.4–10.5)
Chloride: 104 mEq/L (ref 96–112)
Creatinine, Ser: 0.97 mg/dL (ref 0.40–1.20)
GFR: 63.08 mL/min (ref >60.00)
Glucose, Bld: 94 mg/dL (ref 70–99)
Potassium: 4 mEq/L (ref 3.5–5.1)
Sodium: 139 mEq/L (ref 135–145)
Total Bilirubin: 0.7 mg/dL (ref 0.2–1.2)
Total Protein: 7.3 g/dL (ref 6.0–8.3)

## 2023-04-12 LAB — BRAIN NATRIURETIC PEPTIDE: Pro B Natriuretic peptide (BNP): 18 pg/mL (ref 0.0–100.0)

## 2023-04-12 LAB — TSH: TSH: 1.12 u[IU]/mL (ref 0.35–5.50)

## 2023-04-12 MED ORDER — SCOPOLAMINE 1 MG/3DAYS TD PT72: 1.0000 | MEDICATED_PATCH | TRANSDERMAL | 0 refills | Status: DC

## 2023-04-12 MED ORDER — HYDROCHLOROTHIAZIDE 25 MG PO TABS: 12.5000 mg | ORAL_TABLET | Freq: Every day | ORAL | 3 refills | Status: DC | PRN

## 2023-04-12 NOTE — Patient Instructions (Signed)
Please stop at the lab to have labs drawn. Elevate feet above heart when sitting as able. Wear compression hose knee-high 15 to 30 mmHg when sitting or standing prolonged periods. Continue regular exercise.

## 2023-04-12 NOTE — Assessment & Plan Note (Signed)
Acute, ongoing persistently in the last month. Will evaluate for secondary causes with lab evaluation. He has no shortness of breath or chest pain and has recently had a full cardiology workup.  Symptoms correspond most with venous insufficiency.  Unclear why worse more recently except for recent ankle weight use. Encouraged her to wear compression hose, elevate her legs is much as possible.  If lab evaluation within the normal range, she can use a low-dose of hydrochlorothiazide as long as blood pressure is not as low as it is today as a trial for peripheral swelling.  We did discuss how the hydrochlorothiazide may not be able to remove dependent fluid.

## 2023-04-12 NOTE — Progress Notes (Signed)
Patient ID: [name] [name], female    DOB: January 19, 1961, 62 y.o.   MRN: [id number]  This visit was conducted in person.  BP 90/60 (BP Location: Left Arm, Patient Position: Sitting, Cuff Size: Normal)   Pulse (!) 57   Temp 98.4 F (36.9 C) (Temporal)   Ht 5\' 5"  (1.651 m)   Wt 157 lb 4 oz (71.3 kg)   LMP 09/04/2014   SpO2 97%   BMI 26.17 kg/m    CC:  Chief Complaint  Patient presents with   Foot Swelling    Bilateral but left is worse-Seen by Dr. Alphonsus Sias 03/28/23    Subjective:   HPI: [name] [name] is a 62 y.o. female presenting on 04/12/2023 for Foot Swelling (Bilateral but left is worse-Seen by Dr. Alphonsus Sias 03/28/23)  Bilateral leg swelling ongoing x 2.5 Has cut salt in diet but does not eat much anyway, very active, has been working out more. Drinking water. No ETOH    She has been wearing weight around ankles for a while.  BP Readings from Last 3 Encounters:  04/12/23 90/60  03/28/23 100/60  09/19/22 102/78   No CP, no SOB.  At home 100-120/90     ECHO: EF 60%,   Wt Readings from Last 3 Encounters:  04/12/23 157 lb 4 oz (71.3 kg)  03/28/23 158 lb (71.7 kg)  09/19/22 153 lb 6.4 oz (69.6 kg)     Relevant past medical, surgical, family and social history reviewed and updated as indicated. Interim medical history since our last visit reviewed. Allergies and medications reviewed and updated. Outpatient Medications Prior to Visit  Medication Sig Dispense Refill   ibuprofen (ADVIL) 800 MG tablet Take 1 tablet (800 mg total) by mouth every 8 (eight) hours as needed. 60 tablet 0   meclizine (ANTIVERT) 25 MG tablet Take 1 tablet (25 mg total) by mouth 3 (three) times daily as needed for dizziness or nausea. 60 tablet 0   OVER THE COUNTER MEDICATION Viviscal: Hair Vitamin     timolol (TIMOPTIC) 0.5 % ophthalmic solution Place 1 drop into the left eye daily.     naproxen (NAPROSYN) 500 MG tablet Take 1 tablet (500 mg total) by mouth 2 (two) times daily with a meal.  (Patient not taking: Reported on 03/28/2023) 30 tablet 0   No facility-administered medications prior to visit.     Per HPI unless specifically indicated in ROS section below Review of Systems  Cardiovascular:  Positive for leg swelling.   Objective:  BP 90/60 (BP Location: Left Arm, Patient Position: Sitting, Cuff Size: Normal)   Pulse (!) 57   Temp 98.4 F (36.9 C) (Temporal)   Ht 5\' 5"  (1.651 m)   Wt 157 lb 4 oz (71.3 kg)   LMP 09/04/2014   SpO2 97%   BMI 26.17 kg/m   Wt Readings from Last 3 Encounters:  04/12/23 157 lb 4 oz (71.3 kg)  03/28/23 158 lb (71.7 kg)  09/19/22 153 lb 6.4 oz (69.6 kg)      Physical Exam Constitutional:      General: She is not in acute distress.    Appearance: Normal appearance. She is well-developed. She is not ill-appearing or toxic-appearing.  HENT:     Head: Normocephalic.     Right Ear: Hearing, tympanic membrane, ear canal and external ear normal. Tympanic membrane is not erythematous, retracted or bulging.     Left Ear: Hearing, tympanic membrane, ear canal and external ear normal. Tympanic membrane is  not erythematous, retracted or bulging.     Nose: No mucosal edema or rhinorrhea.     Right Sinus: No maxillary sinus tenderness or frontal sinus tenderness.     Left Sinus: No maxillary sinus tenderness or frontal sinus tenderness.     Mouth/Throat:     Pharynx: Uvula midline.  Eyes:     General: Lids are normal. Lids are everted, no foreign bodies appreciated.     Conjunctiva/sclera: Conjunctivae normal.     Pupils: Pupils are equal, round, and reactive to light.  Neck:     Thyroid: No thyroid mass or thyromegaly.     Vascular: No carotid bruit.     Trachea: Trachea normal.  Cardiovascular:     Rate and Rhythm: Normal rate and regular rhythm.     Pulses: Normal pulses.     Heart sounds: Normal heart sounds, S1 normal and S2 normal. No murmur heard.    No friction rub. No gallop.  Pulmonary:     Effort: Pulmonary effort is  normal. No tachypnea or respiratory distress.     Breath sounds: Normal breath sounds. No decreased breath sounds, wheezing, rhonchi or rales.  Abdominal:     General: Bowel sounds are normal.     Palpations: Abdomen is soft.     Tenderness: There is no abdominal tenderness.  Musculoskeletal:     Cervical back: Normal range of motion and neck supple.     Right lower leg: Pitting Edema present.     Left lower leg: Pitting Edema present.  Skin:    General: Skin is warm and dry.     Findings: No rash.  Neurological:     Mental Status: She is alert.  Psychiatric:        Mood and Affect: Mood is not anxious or depressed.        Speech: Speech normal.        Behavior: Behavior normal. Behavior is cooperative.        Thought Content: Thought content normal.        Judgment: Judgment normal.       Results for orders placed or performed in visit on 04/12/23  Comprehensive metabolic panel  Result Value Ref Range   Sodium 139 135 - 145 mEq/L   Potassium 4.0 3.5 - 5.1 mEq/L   Chloride 104 96 - 112 mEq/L   CO2 26 19 - 32 mEq/L   Glucose, Bld 94 70 - 99 mg/dL   BUN 11 6 - 23 mg/dL   Creatinine, Ser 1.19 0.40 - 1.20 mg/dL   Total Bilirubin 0.7 0.2 - 1.2 mg/dL   Alkaline Phosphatase 80 39 - 117 U/L   AST 24 0 - 37 U/L   ALT 18 0 - 35 U/L   Total Protein 7.3 6.0 - 8.3 g/dL   Albumin 4.5 3.5 - 5.2 g/dL   GFR 14.78 >29.56 mL/min   Calcium 10.1 8.4 - 10.5 mg/dL  TSH  Result Value Ref Range   TSH 1.12 0.35 - 5.50 uIU/mL  Brain natriuretic peptide  Result Value Ref Range   Pro B Natriuretic peptide (BNP) 18.0 0.0 - 100.0 pg/mL  CBC with Differential/Platelet  Result Value Ref Range   WBC 4.8 4.0 - 10.5 K/uL   RBC 4.32 3.87 - 5.11 Mil/uL   Hemoglobin 12.5 12.0 - 15.0 g/dL   HCT 21.3 08.6 - 57.8 %   MCV 89.1 78.0 - 100.0 fl   MCHC 32.5 30.0 - 36.0 g/dL   RDW 14.3  11.5 - 15.5 %   Platelets 290.0 150.0 - 400.0 K/uL   Neutrophils Relative % 40.2 (L) 43.0 - 77.0 %   Lymphocytes  Relative 52.0 (H) 12.0 - 46.0 %   Monocytes Relative 6.4 3.0 - 12.0 %   Eosinophils Relative 0.8 0.0 - 5.0 %   Basophils Relative 0.6 0.0 - 3.0 %   Neutro Abs 1.9 1.4 - 7.7 K/uL   Lymphs Abs 2.5 0.7 - 4.0 K/uL   Monocytes Absolute 0.3 0.1 - 1.0 K/uL   Eosinophils Absolute 0.0 0.0 - 0.7 K/uL   Basophils Absolute 0.0 0.0 - 0.1 K/uL    Assessment and Plan  Peripheral edema Assessment & Plan: Acute, ongoing persistently in the last month. Will evaluate for secondary causes with lab evaluation. He has no shortness of breath or chest pain and has recently had a full cardiology workup.  Symptoms correspond most with venous insufficiency.  Unclear why worse more recently except for recent ankle weight use. Encouraged her to wear compression hose, elevate her legs is much as possible.  If lab evaluation within the normal range, she can use a low-dose of hydrochlorothiazide as long as blood pressure is not as low as it is today as a trial for peripheral swelling.  We did discuss how the hydrochlorothiazide may not be able to remove dependent fluid.  Orders: -     Comprehensive metabolic panel -     TSH -     Brain natriuretic peptide -     CBC with Differential/Platelet  Other orders -     Scopolamine; Place 1 patch (1.5 mg total) onto the skin every 3 (three) days.  Dispense: 10 patch; Refill: 0 -     hydroCHLOROthiazide; Take 0.5 tablets (12.5 mg total) by mouth daily as needed (swelling).  Dispense: 90 tablet; Refill: 3    No follow-ups on file.   Kerby Nora, MD

## 2023-04-23 ENCOUNTER — Encounter: Payer: Self-pay | Admitting: Family Medicine

## 2023-04-24 ENCOUNTER — Encounter: Payer: Self-pay | Admitting: Family Medicine

## 2023-04-25 ENCOUNTER — Telehealth: Payer: Self-pay | Admitting: *Deleted

## 2023-04-25 DIAGNOSIS — E559 Vitamin D deficiency, unspecified: Secondary | ICD-10-CM

## 2023-04-25 DIAGNOSIS — E782 Mixed hyperlipidemia: Secondary | ICD-10-CM

## 2023-04-25 DIAGNOSIS — R5383 Other fatigue: Secondary | ICD-10-CM

## 2023-04-25 NOTE — Telephone Encounter (Signed)
-----   Message from Lovena Neighbours sent at 04/25/2023  1:01 PM EDT ----- Regarding: Labs for 8.6.24 Please put physical lab orders in future. Thank you, Denny Peon

## 2023-05-08 ENCOUNTER — Encounter (INDEPENDENT_AMBULATORY_CARE_PROVIDER_SITE_OTHER): Payer: Self-pay

## 2023-05-14 ENCOUNTER — Other Ambulatory Visit (INDEPENDENT_AMBULATORY_CARE_PROVIDER_SITE_OTHER): Payer: 59

## 2023-05-14 DIAGNOSIS — E782 Mixed hyperlipidemia: Secondary | ICD-10-CM

## 2023-05-14 DIAGNOSIS — E559 Vitamin D deficiency, unspecified: Secondary | ICD-10-CM

## 2023-05-14 DIAGNOSIS — R5383 Other fatigue: Secondary | ICD-10-CM

## 2023-05-14 LAB — LIPID PANEL
Cholesterol: 232 mg/dL — ABNORMAL HIGH (ref 0–200)
HDL: 45.5 mg/dL (ref >39.00)
LDL Cholesterol: 154 mg/dL — ABNORMAL HIGH (ref 0–99)
NonHDL: 186.19
Total CHOL/HDL Ratio: 5
Triglycerides: 163 mg/dL — ABNORMAL HIGH (ref 0.0–149.0)
VLDL: 32.6 mg/dL (ref 0.0–40.0)

## 2023-05-14 LAB — VITAMIN B12: Vitamin B-12: 338 pg/mL (ref 211–911)

## 2023-05-14 LAB — VITAMIN D 25 HYDROXY (VIT D DEFICIENCY, FRACTURES): VITD: 22.61 ng/mL — ABNORMAL LOW (ref 30.00–100.00)

## 2023-05-15 ENCOUNTER — Encounter: Payer: Self-pay | Admitting: Family Medicine

## 2023-05-16 MED ORDER — VITAMIN D3 1.25 MG (50000 UT) PO CAPS
ORAL_CAPSULE | ORAL | 0 refills | Status: DC
Start: 2023-05-16 — End: 2023-12-11

## 2023-05-21 ENCOUNTER — Encounter: Payer: Self-pay | Admitting: Family Medicine

## 2023-05-21 ENCOUNTER — Ambulatory Visit: Payer: 59 | Admitting: Family Medicine

## 2023-05-21 VITALS — BP 90/60 | HR 65 | Temp 97.7°F | Ht 64.65 in | Wt 155.2 lb

## 2023-05-21 DIAGNOSIS — Z0000 Encounter for general adult medical examination without abnormal findings: Secondary | ICD-10-CM

## 2023-05-21 DIAGNOSIS — E782 Mixed hyperlipidemia: Secondary | ICD-10-CM | POA: Diagnosis not present

## 2023-05-21 DIAGNOSIS — J4520 Mild intermittent asthma, uncomplicated: Secondary | ICD-10-CM | POA: Diagnosis not present

## 2023-05-21 DIAGNOSIS — E559 Vitamin D deficiency, unspecified: Secondary | ICD-10-CM | POA: Diagnosis not present

## 2023-05-21 DIAGNOSIS — E2839 Other primary ovarian failure: Secondary | ICD-10-CM

## 2023-05-21 DIAGNOSIS — R600 Localized edema: Secondary | ICD-10-CM

## 2023-05-21 MED ORDER — SCOPOLAMINE 1 MG/3DAYS TD PT72: 1.0000 | MEDICATED_PATCH | TRANSDERMAL | 0 refills | Status: DC

## 2023-05-21 NOTE — Assessment & Plan Note (Signed)
Cardiac Calcium score 0  No current indication for statin  med. Encouraged exercise, weight loss, healthy eating habits.

## 2023-05-21 NOTE — Progress Notes (Signed)
Patient ID: [name] [name], female    DOB: December 24, 1960, 62 y.o.   MRN: [id number]  This visit was conducted in person.  BP 90/60 (BP Location: Left Arm, Patient Position: Sitting, Cuff Size: Normal)   Pulse 65   Temp 97.7 F (36.5 C) (Temporal)   Ht 5' 4.65" (1.642 m)   Wt 155 lb 4 oz (70.4 kg)   LMP 09/04/2014   SpO2 96%   BMI 26.12 kg/m    Vitals:   05/21/23 1526  BP: 90/60  Pulse: 65  Temp: 97.7 F (36.5 C)  SpO2: 96%     CC:  Follow up   Subjective:   HPI: [name] [name] is a 62 y.o. 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  Symptoms resolved with HCTX x 2 doses.  Doing weight training once a week.  She has noted ache in right leg  No better with exercise, she is trying to eat better.   Elevated Cholesterol: Inadequate control on no medication.  Given her lack of other risk factors she has decided to continue to work on lifestyle change. Lab Results  Component Value Date   CHOL 232 (H) 05/14/2023   HDL 45.50 05/14/2023   LDLCALC 154 (H) 05/14/2023   LDLDIRECT 126.6 12/22/2012   TRIG 163.0 (H) 05/14/2023   CHOLHDL 5 05/14/2023   The 10-year ASCVD risk score (Arnett DK, et al., 2019) is: 2.7%   Values used to calculate the score:     Age: 49 years     Sex: Female     Is Non-Hispanic African American: Yes     Diabetic: No     Tobacco smoker: No     Systolic Blood Pressure: 90 mmHg     Is BP treated: No     HDL Cholesterol: 45.5 mg/dL     Total Cholesterol: 232 mg/dL Using medications without problems: Muscle aches:  Diet compliance: good Exercise: minimal lately Other complaints: She is not interested in medication to treat.  Mild intermittent asthma:  stable   Vit D def: remains low on supplement     Relevant past medical, surgical, family and social history reviewed and updated as indicated. Interim medical history since our last visit reviewed. Allergies and  medications reviewed and updated. Outpatient Medications Prior to Visit  Medication Sig Dispense Refill   Cholecalciferol (VITAMIN D3) 1.25 MG (50000 UT) CAPS 1 capsule  weekly x 12 weeks 12 capsule 0   ibuprofen (ADVIL) 800 MG tablet Take 1 tablet (800 mg total) by mouth every 8 (eight) hours as needed. 60 tablet 0   OVER THE COUNTER MEDICATION Viviscal: Hair Vitamin     timolol (TIMOPTIC) 0.5 % ophthalmic solution Place 1 drop into the left eye daily.     hydrochlorothiazide (HYDRODIURIL) 25 MG tablet Take 0.5 tablets (12.5 mg total) by mouth daily as needed (swelling). 90 tablet 3   meclizine (ANTIVERT) 25 MG tablet Take 1 tablet (25 mg total) by mouth 3 (three) times daily as needed for dizziness or nausea. 60 tablet 0   scopolamine (TRANSDERM-SCOP) 1 MG/3DAYS Place 1 patch (1.5 mg total) onto the skin every 3 (three) days. 10 patch 0   No facility-administered medications prior to visit.     Per HPI unless specifically indicated in ROS section below Review of Systems  Constitutional:  Negative for fatigue and fever.  HENT:  Negative for congestion.   Eyes:  Negative for pain.  Respiratory:  Negative for cough and shortness of breath.   Cardiovascular:  Negative for chest pain, palpitations and leg swelling.  Gastrointestinal:  Negative for abdominal pain.  Genitourinary:  Negative for dysuria and vaginal bleeding.  Musculoskeletal:  Negative for back pain.  Neurological:  Negative for syncope, light-headedness and headaches.  Psychiatric/Behavioral:  Negative for dysphoric mood.    Objective:  BP 90/60 (BP Location: Left Arm, Patient Position: Sitting, Cuff Size: Normal)   Pulse 65   Temp 97.7 F (36.5 C) (Temporal)   Ht 5' 4.65" (1.642 m)   Wt 155 lb 4 oz (70.4 kg)   LMP 09/04/2014   SpO2 96%   BMI 26.12 kg/m   Wt Readings from Last 3 Encounters:  05/21/23 155 lb 4 oz (70.4 kg)  04/12/23 157 lb 4 oz (71.3 kg)  03/28/23 158 lb (71.7 kg)      Physical Exam Vitals and  nursing note reviewed.  Constitutional:      General: She is not in acute distress.    Appearance: Normal appearance. She is well-developed. She is not ill-appearing or toxic-appearing.  HENT:     Head: Normocephalic.     Right Ear: Hearing, tympanic membrane, ear canal and external ear normal.     Left Ear: Hearing, tympanic membrane, ear canal and external ear normal.     Nose: Nose normal.  Eyes:     General: Lids are normal. Lids are everted, no foreign bodies appreciated.     Conjunctiva/sclera: Conjunctivae normal.     Pupils: Pupils are equal, round, and reactive to light.  Neck:     Thyroid: No thyroid mass or thyromegaly.     Vascular: No carotid bruit.     Trachea: Trachea normal.  Cardiovascular:     Rate and Rhythm: Normal rate and regular rhythm.     Heart sounds: Normal heart sounds, S1 normal and S2 normal. No murmur heard.    No gallop.  Pulmonary:     Effort: Pulmonary effort is normal. No respiratory distress.     Breath sounds: Normal breath sounds. No wheezing, rhonchi or rales.  Abdominal:     General: Bowel sounds are normal. There is no distension or abdominal bruit.     Palpations: Abdomen is soft. There is no fluid wave or mass.     Tenderness: There is no abdominal tenderness. There is no guarding or rebound.     Hernia: No hernia is present.  Musculoskeletal:     Cervical back: Normal range of motion and neck supple.  Lymphadenopathy:     Cervical: No cervical adenopathy.  Skin:    General: Skin is warm and dry.     Findings: No rash.  Neurological:     Mental Status: She is alert.     Cranial Nerves: No cranial nerve deficit.     Sensory: No sensory deficit.  Psychiatric:        Mood and Affect: Mood is not anxious or depressed.        Speech: Speech normal.        Behavior: Behavior normal. Behavior is cooperative.        Judgment: Judgment normal.       Results for orders placed or performed in visit on 05/14/23  VITAMIN D 25 Hydroxy  (Vit-D Deficiency, Fractures)  Result Value Ref Range   VITD 22.61 (L) 30.00 - 100.00 ng/mL  Vitamin B12  Result Value Ref Range   Vitamin B-12 338  211 - 911 pg/mL  Lipid panel  Result Value Ref Range   Cholesterol 232 (H) 0 - 200 mg/dL   Triglycerides 960.4 (H) 0.0 - 149.0 mg/dL   HDL 54.09 >81.19 mg/dL   VLDL 14.7 0.0 - 82.9 mg/dL   LDL Cholesterol 562 (H) 0 - 99 mg/dL   Total CHOL/HDL Ratio 5    NonHDL 186.19      COVID 19 screen:  No recent travel or known exposure to COVID19 The patient denies respiratory symptoms of COVID 19 at this time. The importance of social distancing was discussed today.   Assessment and Plan The patient's preventative maintenance and recommended screening tests for an annual wellness exam were reviewed in full today. Brought up to date unless services declined.  Counselled on the importance of diet, exercise, and its role in overall health and mortality. The patient's FH and SH was reviewed, including their home life, tobacco status, and drug and alcohol status.   Former smoker GYN: GYN  04/2023 Mammogram:  05/2018  GYN   schedule this month Vaccine: Uptodate  S/P COVID19 x 3,  Shingrix and tdap due, not interested Colon: 09/2018 nml, int hemorrhoids, Dr. Russella Dar,  repeat in 5 years.  Hep C: negative  HIV declined  DEXA: due Problem List Items Addressed This Visit     Hyperlipidemia    Cardiac Calcium score 0  No current indication for statin  med. Encouraged exercise, weight loss, healthy eating habits.         Mild intermittent asthma    Chronic, stable well-controlled on no medication      Peripheral edema    Acute, improved significantly after 2 doses of hydrochlorothiazide.  Now treating with with increase in activity      Vitamin D deficiency    Weekly supplement x 12 weeks      Other Visit Diagnoses     Routine general medical examination at a health care facility    -  Primary   Estrogen deficiency       Relevant  Orders   DG Bone Density       Meds ordered this encounter  Medications   scopolamine (TRANSDERM-SCOP) 1 MG/3DAYS    Sig: Place 1 patch (1.5 mg total) onto the skin every 3 (three) days.    Dispense:  5 patch    Refill:  0    Patient will be using a GoodRx Coupon     Kerby Nora, MD

## 2023-05-27 ENCOUNTER — Encounter: Payer: Self-pay | Admitting: Family Medicine

## 2023-05-30 NOTE — Assessment & Plan Note (Signed)
Chronic, stable well-controlled on no medication

## 2023-05-30 NOTE — Assessment & Plan Note (Signed)
Weekly supplement x 12 weeks

## 2023-05-30 NOTE — Assessment & Plan Note (Signed)
Acute, improved significantly after 2 doses of hydrochlorothiazide.  Now treating with with increase in activity

## 2023-06-21 NOTE — Telephone Encounter (Signed)
I called and spoke with Ms.Laird and gave her the phone number to [facility] [place] so she can call and schedule her Bone Density.

## 2023-07-24 ENCOUNTER — Encounter: Payer: Self-pay | Admitting: Family Medicine

## 2023-08-13 ENCOUNTER — Encounter: Payer: Self-pay | Admitting: Gastroenterology

## 2023-09-25 ENCOUNTER — Other Ambulatory Visit: Payer: Self-pay

## 2023-09-25 ENCOUNTER — Emergency Department (HOSPITAL_BASED_OUTPATIENT_CLINIC_OR_DEPARTMENT_OTHER)
Admission: EM | Admit: 2023-09-25 | Discharge: 2023-09-25 | Disposition: A | Discharge status: Home / Self Care | Payer: 59 | Attending: Emergency Medicine | Admitting: Emergency Medicine

## 2023-09-25 ENCOUNTER — Encounter (HOSPITAL_BASED_OUTPATIENT_CLINIC_OR_DEPARTMENT_OTHER): Payer: Self-pay

## 2023-09-25 ENCOUNTER — Emergency Department (HOSPITAL_BASED_OUTPATIENT_CLINIC_OR_DEPARTMENT_OTHER): Payer: 59

## 2023-09-25 ENCOUNTER — Ambulatory Visit: Payer: Self-pay | Admitting: Family Medicine

## 2023-09-25 DIAGNOSIS — R202 Paresthesia of skin: Secondary | ICD-10-CM | POA: Diagnosis not present

## 2023-09-25 DIAGNOSIS — Z87891 Personal history of nicotine dependence: Secondary | ICD-10-CM | POA: Insufficient documentation

## 2023-09-25 DIAGNOSIS — M79622 Pain in left upper arm: Secondary | ICD-10-CM | POA: Diagnosis not present

## 2023-09-25 DIAGNOSIS — M79605 Pain in left leg: Secondary | ICD-10-CM | POA: Diagnosis present

## 2023-09-25 DIAGNOSIS — M79602 Pain in left arm: Secondary | ICD-10-CM

## 2023-09-25 LAB — CBC
HCT: 38.8 % (ref 36.0–46.0)
Hemoglobin: 13.2 g/dL (ref 12.0–15.0)
MCH: 29.3 pg (ref 26.0–34.0)
MCHC: 34 g/dL (ref 30.0–36.0)
MCV: 86.2 fL (ref 80.0–100.0)
Platelets: 267 10*3/uL (ref 150–400)
RBC: 4.5 MIL/uL (ref 3.87–5.11)
RDW: 13.5 % (ref 11.5–15.5)
WBC: 4.4 10*3/uL (ref 4.0–10.5)
nRBC: 0 % (ref 0.0–0.2)

## 2023-09-25 LAB — BASIC METABOLIC PANEL
Anion gap: 7 (ref 5–15)
BUN: 14 mg/dL (ref 8–23)
CO2: 25 mmol/L (ref 22–32)
Calcium: 9.8 mg/dL (ref 8.9–10.3)
Chloride: 104 mmol/L (ref 98–111)
Creatinine, Ser: 0.75 mg/dL (ref 0.44–1.00)
GFR, Estimated: >60 mL/min (ref >60)
Glucose, Bld: 97 mg/dL (ref 70–99)
Potassium: 3.7 mmol/L (ref 3.5–5.1)
Sodium: 136 mmol/L (ref 135–145)

## 2023-09-25 LAB — TROPONIN I (HIGH SENSITIVITY): Troponin I (High Sensitivity): 3 ng/L (ref <18)

## 2023-09-25 LAB — D-DIMER, QUANTITATIVE: D-Dimer, Quant: <0.27 ug{FEU}/mL (ref 0.00–0.50)

## 2023-09-25 MED ORDER — HYDROCODONE-ACETAMINOPHEN 5-325 MG PO TABS: 1.0000 | ORAL_TABLET | ORAL | 0 refills | Status: DC | PRN

## 2023-09-25 MED ORDER — ACETAMINOPHEN-CODEINE 300-30 MG PO TABS: 1.0000 | ORAL_TABLET | ORAL | 0 refills | Status: AC | PRN

## 2023-09-25 MED ORDER — HYDROCODONE-ACETAMINOPHEN 5-325 MG PO TABS
1.0000 | ORAL_TABLET | Freq: Once | ORAL | Status: DC
  Filled 2023-09-25: qty 1

## 2023-09-25 NOTE — ED Notes (Signed)
To Ct 

## 2023-09-25 NOTE — Telephone Encounter (Signed)
I spoke with pt; pt said she was sitting in parking lot trying to decide if she needed to go to ED or not. Pt went to [facility] and was advised to go to ED and nurse with triage group also advised pt to go to ED. Pt said that she is still hurting and pt is going to [facility] ED now. Sending note to Dr Ermalene Searing as Lorain Childes.

## 2023-09-25 NOTE — ED Provider Notes (Signed)
[facility] Provider Note   CSN: [id number] Arrival date & time: 09/25/23  1153     History  Chief Complaint  Patient presents with   Fatigue    Left sided     [name] [name] is a 62 y.o. female.  Patient reports she began having pain on the left side of her body on Monday.  Patient reports that she has pain in her left arm.  Patient complains of pain in her left leg.  Patient reports that she has been able to walk but she has pain with moving.  Patient reports that she has had tingling in her arm.  She reports that the blood pressure that they took at triage caused her to have severe pain in her left arm.  Patient states that she was told to come to the emergency department for evaluation.  Patient states that she is concerned that she is having strange symptoms because her sister had similar symptoms when she had a heart attack and died.  Patient reports that she has been seen by cardiology she has had a coronary CT.  Patient reports that they did not see any evidence of any coronary artery disease.  Patient reports she does have elevated cholesterol she is a former smoker.  Patient denies any history of hypertension        Home Medications Prior to Admission medications   Medication Sig Start Date End Date Taking? Authorizing Provider  acetaminophen-codeine (TYLENOL #3) 300-30 MG tablet Take 1 tablet by mouth every 4 (four) hours as needed for up to 3 days for moderate pain (pain score 4-6). 09/25/23 09/28/23 Yes Elson Areas, PA-C  meloxicam (MOBIC) 7.5 MG tablet Take 7.5 mg by mouth daily.   Yes [provider]  Cholecalciferol (VITAMIN D3) 1.25 MG (50000 UT) CAPS 1 capsule  weekly x 12 weeks 05/16/23   Bedsole, Amy E, MD  ibuprofen (ADVIL) 800 MG tablet Take 1 tablet (800 mg total) by mouth every 8 (eight) hours as needed. 09/13/22   Garnette Gunner, MD  OVER THE COUNTER MEDICATION Viviscal: Hair Vitamin    [provider]  scopolamine (TRANSDERM-SCOP) 1 MG/3DAYS Place 1 patch (1.5 mg total) onto the skin every 3 (three) days. 05/21/23   Bedsole, Amy E, MD  timolol (TIMOPTIC) 0.5 % ophthalmic solution Place 1 drop into the left eye daily. 01/20/18   [provider]      Allergies    Augmentin [amoxicillin-pot clavulanate], Doxycycline, and Sulfa antibiotics    Review of Systems   Review of Systems  All other systems reviewed and are negative.   Physical Exam Updated Vital Signs BP 112/71   Pulse 68   Temp 99 F (37.2 C) (Oral)   Resp 18   Ht 5\' 5"  (1.651 m)   Wt 70.3 kg   LMP 09/04/2014   SpO2 (!) 77%   BMI 25.79 kg/m  Physical Exam Vitals and nursing note reviewed.  Constitutional:      Appearance: She is well-developed.  HENT:     Head: Normocephalic.  Cardiovascular:     Rate and Rhythm: Normal rate.  Pulmonary:     Effort: Pulmonary effort is normal.  Abdominal:     General: There is no distension.  Musculoskeletal:        General: No swelling or tenderness. Normal range of motion.     Cervical back: Normal range of motion.     Right lower  leg: No edema.     Left lower leg: No edema.  Skin:    General: Skin is warm.  Neurological:     General: No focal deficit present.     Mental Status: She is alert and oriented to person, place, and time.     ED Results / Procedures / Treatments   Labs (all labs ordered are listed, but only abnormal results are displayed) Labs Reviewed  BASIC METABOLIC PANEL  CBC  D-DIMER, QUANTITATIVE  URINALYSIS, ROUTINE W REFLEX MICROSCOPIC  TROPONIN I (HIGH SENSITIVITY)  TROPONIN I (HIGH SENSITIVITY)    EKG EKG Interpretation Date/Time:  Wednesday September 25 2023 12:05:53 EST Ventricular Rate:  57 PR Interval:  141 QRS Duration:  79 QT Interval:  388 QTC Calculation: 378 R Axis:   33  Text Interpretation: Sinus rhythm Low voltage, precordial leads Confirmed by Tanda Rockers ([id number]) on 09/25/2023 2:27:10  PM  Radiology CT Head Wo Contrast Result Date: 09/25/2023 CLINICAL DATA:  Facial paralysis/weakness, arm weakness EXAM: CT HEAD WITHOUT CONTRAST TECHNIQUE: Contiguous axial images were obtained from the base of the skull through the vertex without intravenous contrast. RADIATION DOSE REDUCTION: This exam was performed according to the departmental dose-optimization program which includes automated exposure control, adjustment of the mA and/or kV according to patient size and/or use of iterative reconstruction technique. COMPARISON:  None Available. FINDINGS: Brain: No evidence of acute infarction, hemorrhage, mass, mass effect, or midline shift. No hydrocephalus or extra-axial fluid collection. Vascular: No hyperdense vessel. Skull: Negative for fracture or focal lesion. Sinuses/Orbits: Mucosal thickening in the ethmoid air cells. No acute finding in the orbits. Other: The mastoid air cells are well aerated. IMPRESSION: No acute intracranial process. Electronically Signed   By: Wiliam Ke M.D.   On: 09/25/2023 16:13   DG Chest 2 View Result Date: 09/25/2023 CLINICAL DATA:  Shortness of breath, chest pain. EXAM: CHEST - 2 VIEW COMPARISON:  June 06, 2022. FINDINGS: The heart size and mediastinal contours are within normal limits. Both lungs are clear. The visualized skeletal structures are unremarkable. IMPRESSION: No active cardiopulmonary disease. Electronically Signed   By: Lupita Raider M.D.   On: 09/25/2023 16:05    Procedures Procedures    Medications Ordered in ED Medications  HYDROcodone-acetaminophen (NORCO/VICODIN) 5-325 MG per tablet 1 tablet (1 tablet Oral Not Given 09/25/23 1712)    ED Course/ Medical Decision Making/ A&P                                 Medical Decision Making Patient complains of pain in her left arm and her left leg patient reports that she has some tingling sensations in her arm and her leg.  Amount and/or Complexity of Data Reviewed Independent  Historian:     Details: Patient is here with family who is supportive External Data Reviewed: notes.    Details: [facility] and cardiology notes reviewed Labs: ordered. Decision-making details documented in ED Course.    Details: Labs ordered reviewed and interpreted troponin is negative D-dimer is negative CBC and chemistries are normal. Radiology: ordered.    Details: Chest x-ray shows no acute changes CT head shows no acute changes ECG/medicine tests: ordered and independent interpretation performed. Decision-making details documented in ED Course.    Details: EKG shows normal sinus rhythm normal EKG  Risk Prescription drug management. Risk Details: Discussed patient's symptoms with her.  Patient reports that she was recently working out  but does not understand why she would be hurting on 1 side and not both.  I suspect patient's symptoms are musculoskeletal.  She does not have any weakness that would lead me to believe that she has had a CVA.  Patient's troponins are negative and she has been having symptoms for 3 days.  I counseled patient on findings I have advised her to schedule to see her primary care physician for a complete physical and evaluation.  Patient is given a prescription for Tylenol 3.  Patient states this is the only pain medicine she can tolerate.  She is advised to return to the emergency department if any problems.           Final Clinical Impression(s) / ED Diagnoses Final diagnoses:  Pain of left upper extremity  Pain of left lower extremity    Rx / DC Orders ED Discharge Orders          Ordered    HYDROcodone-acetaminophen (NORCO/VICODIN) 5-325 MG tablet  Every 4 hours PRN,   Status:  Discontinued        09/25/23 1707    acetaminophen-codeine (TYLENOL #3) 300-30 MG tablet  Every 4 hours PRN        09/25/23 1713          An After Visit Summary was printed and given to the patient.     Elson Areas, PA-C 09/25/23 1856    Sloan Leiter,  DO 10/03/23 2305

## 2023-09-25 NOTE — ED Notes (Signed)
Pt refused the vicodin letting  PA know now

## 2023-09-25 NOTE — Telephone Encounter (Signed)
Agree with ED visit.

## 2023-09-25 NOTE — ED Triage Notes (Signed)
Pt reports that she is having pain on the left side since Monday night. States that she went to UC to be seen and they referred her here. Reports having SOB while hanging up clothes. Pain is from neck to foot on left side.

## 2023-09-25 NOTE — Telephone Encounter (Signed)
Copied from CRM [id number]. Topic: Clinical - Red Word Triage >> Sep 25, 2023 10:22 AM Benetta Spar A wrote:   Chief Complaint: Left sided pain Symptoms: Pain in left neck area behind the ear, left jaw, left shoulder pain, left leg pain, mild SOB with exertion. No breathing difficulty at rest.  Frequency: Ongoing for the past couple days Pertinent Negatives: Patient denies chest pain, denies visual changes Disposition: [x] ED /[] Urgent Care (no appt availability in office) / [] Appointment(In office/virtual)/ []  [facility] [place] [facility]/ [] Home Care/ [] Refused Recommended Disposition /[] [facility]/ []  Follow-up with PCP  Additional Notes: Patient is experiencing pain on her entire left side that is interfering with her normal activities and causing inability to sleep. She reports pain in left neck, jaw, left shoulder, left arm and leg. She has tried Ibuprofen, Naproxen, and Meloxicam without any relief. She stated she went to an urgent care facility today and they were unable to do anything for her so they recommended ED. Patient called to determine if she could be seen in the office with her provider. This RN informed patient that the ED would be the best option for the most immediate evaluation and pain control. Next available appt with PCP is tomorrow. Patient stated she will go to the ED today.   Red Word that prompted transfer to Nurse Triage: Patient is complaining of pain in her left eye. On a scale of 1 to 10 she said a 7. Reason for Disposition  [1] SEVERE pain (e.g., excruciating, unable to do any normal activities) AND [2] not improved 2 hours after pain medicine  Neck pain (and neurologic deficit)  Answer Assessment - Initial Assessment Questions 1. ONSET: "When did the muscle aches or body pains start?"      Monday night  2. LOCATION: "What part of your body is hurting?" (e.g., entire body, arms, legs)      Entire left side, neck (behind ear), left shoulder, left arm, left  leg  3. SEVERITY: "How bad is the pain?" (Scale 1-10; or mild, moderate, severe)   - MILD (1-3): doesn't interfere with normal activities    - MODERATE (4-7): interferes with normal activities or awakens from sleep    - SEVERE (8-10):  excruciating pain, unable to do any normal activities      Patient stated pain is 7/10 initially, but then stated it is hard to tell  4. CAUSE: "What do you think is causing the pains?"     No. Patient did work out recently on Monday, but does not think it is related because she only walked on the treadmill and pull down machine.   5. FEVER: "Have you been having fever?"     No  6. OTHER SYMPTOMS: "Do you have any other symptoms?" (e.g., chest pain, weakness, rash, cold or flu symptoms, weight loss)     Denies chest pain. Reports weakness/tingling left hand that comes and goes  Answer Assessment - Initial Assessment Questions 1. SYMPTOM: "What is the main symptom you are concerned about?" (e.g., weakness, numbness)     Weakness/tingling left hand 2. ONSET: "When did this start?" (minutes, hours, days; while sleeping)     Monday night and has continued since 4. PATTERN "Does this come and go, or has it been constant since it started?"  "Is it present now?"     Comes and goes 5. CARDIAC SYMPTOMS: "Have you had any of the following symptoms: chest pain, difficulty breathing, palpitations?"     Mild shortness of  breath earlier today with exertion. None at this moment 6. NEUROLOGIC SYMPTOMS: "Have you had any of the following symptoms: headache, dizziness, vision loss, double vision, changes in speech, unsteady on your feet?"     No 7. OTHER SYMPTOMS: "Do you have any other symptoms?" Left sided pain (neck, arm, shoulder, leg)  Protocols used: Muscle Aches and Body Pain-A-AH, Neurologic Deficit-A-AH

## 2023-10-21 ENCOUNTER — Encounter: Payer: Self-pay | Admitting: Family Medicine

## 2023-10-21 ENCOUNTER — Ambulatory Visit: Payer: Self-pay | Admitting: Family Medicine

## 2023-10-21 NOTE — Telephone Encounter (Signed)
 I spoke with [name] [name];  [name] [name] said she has not had vomiting since 10/20/23; [name] [name] is drinking a lot of water. [name] [name] said for last 2-3 hr has had no BM and no blood seen. [name] [name] said still mild lower abd pain with 4 pain level. No fever. No UTI symptoms but some lower back pain., [name] [name] not sure if  has hemorrhoid or not. [name] [name] already has appt scheduled with Dr Avelina on 10/22/23 at 8:40 and UC & ED precautions given and [name] [name] voiced understanding. Sending note to Dr Avelina who is out of office and Bedsole poole.

## 2023-10-21 NOTE — Telephone Encounter (Signed)
 Rosina RN with [facility] said pt had vomiting on 10/20/23; today diarrhea x 7 with blood; pt also rectal bleeding without BM. Pt refused ED disposition.  I was unable to reach pt by phone; left v/m requesting pt cb to [facility]. Sending note to Dr Avelina who is out of office. [facility] pool and [facility] triage.

## 2023-10-21 NOTE — Telephone Encounter (Signed)
  Chief Complaint: blood in stool Symptoms: vomiting, blood in stool Frequency: began last night Pertinent Negatives: Patient denies dizziness, nausea at this time, blood thinner Disposition: [x] ED /[] Urgent Care (no appt availability in office) / [] Appointment(In office/virtual)/ []  [facility]/ [] Home Care/ [x] Refused Recommended Disposition /[] [facility]/ []  Follow-up with PCP Additional Notes: Patient calls stating that she had one episode of vomiting last night and then began having diarrhea and abdominal pain. Patient reports that she has had several episodes of diarrhea with blood in stool and now she is passing blood clots without stools. States they are bright red in color. Denies blood thinner. Per protocol, patient to be evaluated in ED. Patient declines dispo, requesting callback from PCP. States she does not feel she will receive good care in ED. Care advice reviewed, advised patient to monitor for worsening s/s. This RN contacted [facility], spoke to [place], RN regarding patient situation. Alerting PCP for review.   Copied from CRM [id number]. Topic: Clinical - Red Word Triage >> Oct 21, 2023 10:33 AM Thersia BROCKS wrote: [facility] that prompted transfer to Nurse Triage: Patient called in stated she thought she had food poisoning , but has a lot of blood in stool, also has really bad stomach pains. Reason for Disposition  [1] MODERATE rectal bleeding (small blood clots, passing blood without stool, or toilet water turns red) AND [2] more than once a day  Answer Assessment - Initial Assessment Questions 1. APPEARANCE of BLOOD: What color is it? Is it passed separately, on the surface of the stool, or mixed in with the stool?      Bright red, passed without stool 2. AMOUNT: How much blood was passed?      Several times, first time approx 1 tablespoon, now it looks like clots 3. FREQUENCY: How many times has blood been passed with the stools?      Approx 4 times since  last night 4. ONSET: When was the blood first seen in the stools? (Days or weeks)      Last night 5. DIARRHEA: Is there also some diarrhea? If Yes, ask: How many diarrhea stools in the past 24 hours?      Yes, approx 7 times 6. CONSTIPATION: Do you have constipation? If Yes, ask: How bad is it?     Denies 7. RECURRENT SYMPTOMS: Have you had blood in your stools before? If Yes, ask: When was the last time? and What happened that time?      Yes, but subsides with bowel movement. 8. BLOOD THINNERS: Do you take any blood thinners? (e.g., Coumadin/warfarin, Pradaxa/dabigatran, aspirin)     Denies 9. OTHER SYMPTOMS: Do you have any other symptoms?  (e.g., abdomen pain, vomiting, dizziness, fever)     Abdominal pain, one episode of vomiting.  Protocols used: Rectal Bleeding-A-AH

## 2023-10-22 ENCOUNTER — Encounter: Payer: Self-pay | Admitting: Family Medicine

## 2023-10-22 ENCOUNTER — Ambulatory Visit: Payer: 59 | Admitting: Family Medicine

## 2023-10-22 VITALS — BP 96/60 | HR 61 | Temp 98.1°F | Ht 64.65 in

## 2023-10-22 DIAGNOSIS — R1084 Generalized abdominal pain: Secondary | ICD-10-CM | POA: Diagnosis not present

## 2023-10-22 DIAGNOSIS — R197 Diarrhea, unspecified: Secondary | ICD-10-CM

## 2023-10-22 DIAGNOSIS — R1111 Vomiting without nausea: Secondary | ICD-10-CM

## 2023-10-22 LAB — CBC WITH DIFFERENTIAL/PLATELET
Basophils Absolute: 0 10*3/uL (ref 0.0–0.1)
Basophils Relative: 0.5 % (ref 0.0–3.0)
Eosinophils Absolute: 0.1 10*3/uL (ref 0.0–0.7)
Eosinophils Relative: 1.1 % (ref 0.0–5.0)
HCT: 38.6 % (ref 36.0–46.0)
Hemoglobin: 12.8 g/dL (ref 12.0–15.0)
Lymphocytes Relative: 34.7 % (ref 12.0–46.0)
Lymphs Abs: 1.8 10*3/uL (ref 0.7–4.0)
MCHC: 33.3 g/dL (ref 30.0–36.0)
MCV: 88.5 fL (ref 78.0–100.0)
Monocytes Absolute: 0.4 10*3/uL (ref 0.1–1.0)
Monocytes Relative: 6.7 % (ref 3.0–12.0)
Neutro Abs: 3 10*3/uL (ref 1.4–7.7)
Neutrophils Relative %: 57 % (ref 43.0–77.0)
Platelets: 264 10*3/uL (ref 150.0–400.0)
RBC: 4.36 Mil/uL (ref 3.87–5.11)
RDW: 13.8 % (ref 11.5–15.5)
WBC: 5.3 10*3/uL (ref 4.0–10.5)

## 2023-10-22 LAB — LIPASE: Lipase: 14 U/L (ref 11.0–59.0)

## 2023-10-22 LAB — COMPREHENSIVE METABOLIC PANEL
ALT: 16 U/L (ref 0–35)
AST: 21 U/L (ref 0–37)
Albumin: 4.5 g/dL (ref 3.5–5.2)
Alkaline Phosphatase: 89 U/L (ref 39–117)
BUN: 17 mg/dL (ref 6–23)
CO2: 27 meq/L (ref 19–32)
Calcium: 9.8 mg/dL (ref 8.4–10.5)
Chloride: 104 meq/L (ref 96–112)
Creatinine, Ser: 0.9 mg/dL (ref 0.40–1.20)
GFR: 68.76 mL/min (ref >60.00)
Glucose, Bld: 95 mg/dL (ref 70–99)
Potassium: 3.8 meq/L (ref 3.5–5.1)
Sodium: 139 meq/L (ref 135–145)
Total Bilirubin: 0.6 mg/dL (ref 0.2–1.2)
Total Protein: 7.5 g/dL (ref 6.0–8.3)

## 2023-10-22 NOTE — Progress Notes (Signed)
 Patient ID: [name] [name], female    DOB: 1960/10/25, 63 y.o.   MRN: [id number]  This visit was conducted in person.  BP 96/60 (BP Location: Left Arm, Patient Position: Sitting, Cuff Size: Normal)   Pulse 61   Temp 98.1 F (36.7 C) (Temporal)   Ht 5' 4.65 (1.642 m)   LMP 09/04/2014   SpO2 98%   BMI 26.07 kg/m    CC:  Chief Complaint  Patient presents with   Blood In Stools   Abdominal Pain    Subjective:   HPI: [name] [name] is a 63 y.o. female presenting on 10/22/2023 for Blood In Stools and Abdominal Pain   New onset emesis on 1/12.. followed by abdominal pain ( BBQ chicken, fries, roll and salad) and blood diarrhea.  Started after eating at [employer]... felt full but ate half as much...  3-4 hours later emesis.  Diarrhea copious x7 .SABRA Noting blood  on toilet tissue and in toilet.  BMs slowed down, no further emesis. Pushing fluids 32 oz, nml UOP. Minimal food. No rectal soreness   Currently having abdominal cramping every 10-15 min. Feeling like gas, generalized low abdomen.  No BM since 1/13, but no blood in toilet.  No fever.  Pepto bismol has not helped.    Low back pain for 2-3 weeks.    Picture of stool  on  1/13 AM     Colonoscopy: 09/2018 Dr Aneita: internal hemorrhoids  BP Readings from Last 3 Encounters:  10/22/23 96/60  09/25/23 112/71  05/21/23 90/60     Relevant past medical, surgical, family and social history reviewed and updated as indicated. Interim medical history since our last visit reviewed. Allergies and medications reviewed and updated. Outpatient Medications Prior to Visit  Medication Sig Dispense Refill   Cholecalciferol (VITAMIN D3) 1.25 MG (50000 UT) CAPS 1 capsule  weekly x 12 weeks 12 capsule 0   ibuprofen  (ADVIL ) 800 MG tablet Take 1 tablet (800 mg total) by mouth every 8 (eight) hours as needed. 60 tablet 0   OVER THE COUNTER MEDICATION Viviscal: Hair Vitamin     timolol (TIMOPTIC) 0.5 % ophthalmic solution  Place 1 drop into the left eye daily.     meloxicam (MOBIC) 7.5 MG tablet Take 7.5 mg by mouth daily.     scopolamine  (TRANSDERM-SCOP) 1 MG/3DAYS Place 1 patch (1.5 mg total) onto the skin every 3 (three) days. 5 patch 0   No facility-administered medications prior to visit.     Per HPI unless specifically indicated in ROS section below Review of Systems  Constitutional:  Negative for fatigue and fever.  HENT:  Negative for congestion.   Eyes:  Negative for pain.  Respiratory:  Negative for cough and shortness of breath.   Cardiovascular:  Negative for chest pain, palpitations and leg swelling.  Gastrointestinal:  Positive for abdominal distention, abdominal pain, diarrhea and vomiting. Negative for rectal pain.  Genitourinary:  Negative for dysuria and vaginal bleeding.  Musculoskeletal:  Negative for back pain.  Neurological:  Negative for syncope, light-headedness and headaches.  Psychiatric/Behavioral:  Negative for dysphoric mood.    Objective:  BP 96/60 (BP Location: Left Arm, Patient Position: Sitting, Cuff Size: Normal)   Pulse 61   Temp 98.1 F (36.7 C) (Temporal)   Ht 5' 4.65 (1.642 m)   LMP 09/04/2014   SpO2 98%   BMI 26.07 kg/m   Wt Readings from Last 3 Encounters:  09/25/23 155 lb (70.3 kg)  05/21/23  155 lb 4 oz (70.4 kg)  04/12/23 157 lb 4 oz (71.3 kg)      Physical Exam Constitutional:      General: She is not in acute distress.    Appearance: Normal appearance. She is well-developed. She is not ill-appearing or toxic-appearing.  HENT:     Head: Normocephalic.     Right Ear: Hearing, tympanic membrane, ear canal and external ear normal. Tympanic membrane is not erythematous, retracted or bulging.     Left Ear: Hearing, tympanic membrane, ear canal and external ear normal. Tympanic membrane is not erythematous, retracted or bulging.     Nose: No mucosal edema or rhinorrhea.     Right Sinus: No maxillary sinus tenderness or frontal sinus tenderness.      Left Sinus: No maxillary sinus tenderness or frontal sinus tenderness.     Mouth/Throat:     Pharynx: Uvula midline.  Eyes:     General: Lids are normal. Lids are everted, no foreign bodies appreciated.     Conjunctiva/sclera: Conjunctivae normal.     Pupils: Pupils are equal, round, and reactive to light.  Neck:     Thyroid : No thyroid  mass or thyromegaly.     Vascular: No carotid bruit.     Trachea: Trachea normal.  Cardiovascular:     Rate and Rhythm: Normal rate and regular rhythm.     Pulses: Normal pulses.     Heart sounds: Normal heart sounds, S1 normal and S2 normal. No murmur heard.    No friction rub. No gallop.  Pulmonary:     Effort: Pulmonary effort is normal. No tachypnea or respiratory distress.     Breath sounds: Normal breath sounds. No decreased breath sounds, wheezing, rhonchi or rales.  Abdominal:     General: Abdomen is flat. Bowel sounds are decreased.     Palpations: Abdomen is soft.     Tenderness: There is no abdominal tenderness. There is no right CVA tenderness or left CVA tenderness.     Hernia: No hernia is present.     Comments: No abdominal tenderness, just remarks feeling tight in low abdomen  Musculoskeletal:     Cervical back: Normal range of motion and neck supple.  Skin:    General: Skin is warm and dry.     Findings: No rash.  Neurological:     Mental Status: She is alert.  Psychiatric:        Mood and Affect: Mood is not anxious or depressed.        Speech: Speech normal.        Behavior: Behavior normal. Behavior is cooperative.        Thought Content: Thought content normal.        Judgment: Judgment normal.       Results for orders placed or performed during the hospital encounter of 09/25/23  Basic metabolic panel   Collection Time: 09/25/23 12:05 PM  Result Value Ref Range   Sodium 136 135 - 145 mmol/L   Potassium 3.7 3.5 - 5.1 mmol/L   Chloride 104 98 - 111 mmol/L   CO2 25 22 - 32 mmol/L   Glucose, Bld 97 70 - 99 mg/dL    BUN 14 8 - 23 mg/dL   Creatinine, Ser 9.24 0.44 - 1.00 mg/dL   Calcium 9.8 8.9 - 89.6 mg/dL   GFR, Estimated >39 >39 mL/min   Anion gap 7 5 - 15  CBC   Collection Time: 09/25/23 12:05 PM  Result Value Ref Range  WBC 4.4 4.0 - 10.5 K/uL   RBC 4.50 3.87 - 5.11 MIL/uL   Hemoglobin 13.2 12.0 - 15.0 g/dL   HCT 61.1 63.9 - 53.9 %   MCV 86.2 80.0 - 100.0 fL   MCH 29.3 26.0 - 34.0 pg   MCHC 34.0 30.0 - 36.0 g/dL   RDW 86.4 88.4 - 84.4 %   Platelets 267 150 - 400 K/uL   nRBC 0.0 0.0 - 0.2 %  Troponin I (High Sensitivity)   Collection Time: 09/25/23  1:22 PM  Result Value Ref Range   Troponin I (High Sensitivity) 3 <18 ng/L  D-dimer, quantitative   Collection Time: 09/25/23  1:23 PM  Result Value Ref Range   D-Dimer, Quant <0.27 0.00 - 0.50 ug/mL-FEU    Assessment and Plan  Generalized abdominal pain -     CBC with Differential/Platelet -     Comprehensive metabolic panel -     Lipase  Bloody diarrhea Assessment & Plan: Acute, most consistent with viral or bacterial gastroenteritis, blood in stool possibly due to infectious etiology versus irritated internal hemorrhoids. Diarrhea has now stopped, unable to get stool sample for testing. No evidence of dehydration, patient able to keep up with liquids.  Encouraged her to advance diet as tolerated.  Can use Tylenol  for pain.  She has some Tylenol  with codeine  at home she could use if needed for more discomfort. Will evaluate with CBC, lipase and complete metabolic panel to rule out other secondary cause. No focal pain and no current indication for abdominal CT scan.  Diverticulitis unlikely given no diverticulosis on past colonoscopy and nonfocal pain.  Return and ER precautions provided   Vomiting without nausea, unspecified vomiting type    No follow-ups on file.   Greig Ring, MD

## 2023-10-22 NOTE — Telephone Encounter (Signed)
 Noted.

## 2023-10-22 NOTE — Patient Instructions (Addendum)
 Please stop at the lab to have labs drawn.  Can use tylenol  or tylenol  #3 for pain.  Push fluids, advance  diet as tolerated.  If pain not improving.. increasing or more focal.. call for further recommendations.  Go to ER if severe pain or unable to keep down fluids.

## 2023-10-22 NOTE — Assessment & Plan Note (Signed)
 Acute, most consistent with viral or bacterial gastroenteritis, blood in stool possibly due to infectious etiology versus irritated internal hemorrhoids. Diarrhea has now stopped, unable to get stool sample for testing. No evidence of dehydration, patient able to keep up with liquids.  Encouraged her to advance diet as tolerated.  Can use Tylenol  for pain.  She has some Tylenol  with codeine  at home she could use if needed for more discomfort. Will evaluate with CBC, lipase and complete metabolic panel to rule out other secondary cause. No focal pain and no current indication for abdominal CT scan.  Diverticulitis unlikely given no diverticulosis on past colonoscopy and nonfocal pain.  Return and ER precautions provided

## 2023-12-09 ENCOUNTER — Other Ambulatory Visit: Payer: Self-pay | Admitting: Family Medicine

## 2023-12-09 NOTE — Telephone Encounter (Signed)
 Last office visit 10/22/23 for stool in stool/Abd Pain.  Last refilled 05/16/23 for #12 with no refills.  Vit D level 05/14/2023 low at 22.61.  Next appt: No future appointments.

## 2023-12-25 ENCOUNTER — Ambulatory Visit: Payer: Self-pay | Admitting: *Deleted

## 2023-12-25 NOTE — Telephone Encounter (Signed)
  Chief Complaint: back pain, urinary incontinence  Symptoms: started Sunday night Frequency: urinary frequency, pressure, leakage- worse- but back discomfort started Sunday PM  Disposition: [] ED /[] Urgent Care (no appt availability in office) / [x] Appointment(In office/virtual)/ []  [facility]/ [] Home Care/ [] Refused Recommended Disposition /[] [facility]/ []  Follow-up with PCP Additional Notes: Appointment scheduled for evaluation   Copied from CRM [id number]. Topic: Clinical - Red Word Triage >> Dec 25, 2023 11:29 AM [place] A wrote: [facility] that prompted transfer to Nurse Triage: Patient has severe back pain 1-10; 8- pt says she was up all night with pain also going down her legs Reason for Disposition  [1] Pain radiates into the thigh or further down the leg AND [2] one leg  [1] Can't control passage of urine (i.e., urinary incontinence) AND [2] new-onset (< 2 weeks) or worsening  Answer Assessment - Initial Assessment Questions 1. ONSET: "When did the pain begin?"      Sunday night 2. LOCATION: "Where does it hurt?" (upper, mid or lower back)     Dull back pain-mid to R side into the leg 3. SEVERITY: "How bad is the pain?"  (e.g., Scale 1-10; mild, moderate, or severe)   - MILD (1-3): Doesn't interfere with normal activities.    - MODERATE (4-7): Interferes with normal activities or awakens from sleep.    - SEVERE (8-10): Excruciating pain, unable to do any normal activities.      Sitting- no pian- "fullness/pull", bending- moderate 4. PATTERN: "Is the pain constant?" (e.g., yes, no; constant, intermittent)      Off/on 5. RADIATION: "Does the pain shoot into your legs or somewhere else?"     Into the leg when bending down 6. CAUSE:  "What do you think is causing the back pain?"      unsure 7. BACK OVERUSE:  "Any recent lifting of heavy objects, strenuous work or exercise?"     No- went walking- normal 8. MEDICINES: "What have you taken so far for the  pain?" (e.g., nothing, acetaminophen, NSAIDS)     Tried ibuprofen, tylenol- not helping 9. NEUROLOGIC SYMPTOMS: "Do you have any weakness, numbness, or problems with bowel/bladder control?"     weakness 10. OTHER SYMPTOMS: "Do you have any other symptoms?" (e.g., fever, abdomen pain, burning with urination, blood in urine)       frequency  Protocols used: Back Pain-A-AH, Urinary Symptoms-A-AH

## 2023-12-25 NOTE — Telephone Encounter (Signed)
 Noted.

## 2023-12-26 ENCOUNTER — Ambulatory Visit (INDEPENDENT_AMBULATORY_CARE_PROVIDER_SITE_OTHER)
Admission: RE | Admit: 2023-12-26 | Discharge: 2023-12-26 | Disposition: A | Discharge status: Home / Self Care | Source: Ambulatory Visit | Attending: Family Medicine | Admitting: Family Medicine

## 2023-12-26 ENCOUNTER — Ambulatory Visit: Admitting: Family Medicine

## 2023-12-26 ENCOUNTER — Encounter: Payer: Self-pay | Admitting: Family Medicine

## 2023-12-26 VITALS — BP 128/70 | HR 54 | Temp 97.7°F | Ht 64.65 in | Wt 155.4 lb

## 2023-12-26 DIAGNOSIS — R32 Unspecified urinary incontinence: Secondary | ICD-10-CM | POA: Insufficient documentation

## 2023-12-26 DIAGNOSIS — M5441 Lumbago with sciatica, right side: Secondary | ICD-10-CM

## 2023-12-26 DIAGNOSIS — W19XXXA Unspecified fall, initial encounter: Secondary | ICD-10-CM | POA: Insufficient documentation

## 2023-12-26 LAB — POC URINALSYSI DIPSTICK (AUTOMATED)
Bilirubin, UA: NEGATIVE
Blood, UA: NEGATIVE
Glucose, UA: NEGATIVE
Ketones, UA: NEGATIVE
Leukocytes, UA: NEGATIVE
Nitrite, UA: NEGATIVE
Protein, UA: NEGATIVE
Spec Grav, UA: 1.025 (ref 1.010–1.025)
Urobilinogen, UA: 0.2 U/dL
pH, UA: 6 (ref 5.0–8.0)

## 2023-12-26 MED ORDER — TRAMADOL HCL 50 MG PO TABS: 50.0000 mg | ORAL_TABLET | Freq: Three times a day (TID) | ORAL | 0 refills | Status: AC | PRN

## 2023-12-26 MED ORDER — CYCLOBENZAPRINE HCL 5 MG PO TABS: 5.0000 mg | ORAL_TABLET | Freq: Three times a day (TID) | ORAL | 1 refills | Status: DC | PRN

## 2023-12-26 MED ORDER — PREDNISONE 20 MG PO TABS: 20.0000 mg | ORAL_TABLET | Freq: Every day | ORAL | 0 refills | Status: DC

## 2023-12-26 NOTE — Assessment & Plan Note (Addendum)
 Chronic, her urinary incontinence is been ongoing for greater than 1 year.  This is most likely stress incontinence and not clearly worsened with recent back injury. Given she does report some pain radiating to right lower quadrant if she has changing or worsening symptoms in this area she will contact me.

## 2023-12-26 NOTE — Progress Notes (Signed)
 Patient ID: [name] [name], female    DOB: [birth year], 63 y.o.   MRN: [id number]  This visit was conducted in person.  BP 128/70   Pulse (!) 54   Temp 97.7 F (36.5 C) (Oral)   Ht 5' 4.65" (1.642 m)   Wt 155 lb 6 oz (70.5 kg)   LMP 09/04/2014   SpO2 97%   BMI 26.14 kg/m    CC:  Chief Complaint  Patient presents with   Back Pain    C/o low R-side back pain- radiating down leg and around to RLQ abd pain and urine incontinence. Sxs started 12/24/23- worsening. Pt states she fell about 4 wks ago.     Subjective:   HPI: [name] [name] is a 63 y.o. female presenting on 12/26/2023 for Back Pain (C/o low R-side back pain- radiating down leg and around to RLQ abd pain and urine incontinence. Sxs started 12/24/23- worsening. Pt states she fell about 4 wks ago. )  New onset right sided low back pain radiating down to  right buttock/leg and around to right lower quadrant abdomen. Shooting pain to leg.  Symptoms started 2 days ago.  Causing trouble sleeping.  Feels weaker  given the pain.  No numbness.  She has also noted associated urinary incontinence, when walking loses urine in small amounts... this is ongoing for 1 years. Wears pads.  No change, no dysuria. She reports she did have shooting pain a few days ago from her vaginal area up through her body  She does report she fell 4 weeks ago. Lost balance. Fell to ground on right side..  irritated right lateral foot.  Wearing heels more often lately.  Tried ibuprofen 800 mg, tylenol.. no improvement  Old hydrocodone helped with pain somewhat.    Urinalysis is unremarkable: No nitrite no leukocytes no blood   Hx of sciatica .Marland Kitchen S/P steroid injection  Relevant past medical, surgical, family and social history reviewed and updated as indicated. Interim medical history since our last visit reviewed. Allergies and medications reviewed and updated. Outpatient Medications Prior to Visit  Medication Sig Dispense Refill    Cholecalciferol (VITAMIN D3) 1.25 MG (50000 UT) CAPS TAKE 1 CAPSULE BY MOUTH WEEKLY FOR 12 WEEKS 12 capsule 0   ibuprofen (ADVIL) 800 MG tablet Take 1 tablet (800 mg total) by mouth every 8 (eight) hours as needed. 60 tablet 0   OVER THE COUNTER MEDICATION Viviscal: Hair Vitamin     timolol (TIMOPTIC) 0.5 % ophthalmic solution Place 1 drop into the left eye daily.     No facility-administered medications prior to visit.     Per HPI unless specifically indicated in ROS section below Review of Systems  Constitutional:  Negative for fatigue and fever.  HENT:  Negative for congestion.   Eyes:  Negative for pain.  Respiratory:  Negative for cough and shortness of breath.   Cardiovascular:  Negative for chest pain, palpitations and leg swelling.  Gastrointestinal:  Positive for abdominal pain. Negative for diarrhea, nausea and vomiting.  Genitourinary:  Negative for dysuria and vaginal bleeding.  Musculoskeletal:  Positive for back pain.  Neurological:  Negative for syncope, light-headedness and headaches.  Psychiatric/Behavioral:  Negative for dysphoric mood.    Objective:  BP 128/70   Pulse (!) 54   Temp 97.7 F (36.5 C) (Oral)   Ht 5' 4.65" (1.642 m)   Wt 155 lb 6 oz (70.5 kg)   LMP 09/04/2014   SpO2 97%   BMI 26.14  kg/m   Wt Readings from Last 3 Encounters:  12/26/23 155 lb 6 oz (70.5 kg)  09/25/23 155 lb (70.3 kg)  05/21/23 155 lb 4 oz (70.4 kg)      Physical Exam Constitutional:      General: She is not in acute distress.    Appearance: Normal appearance. She is well-developed. She is not ill-appearing or toxic-appearing.  HENT:     Head: Normocephalic.     Right Ear: Hearing, tympanic membrane, ear canal and external ear normal. Tympanic membrane is not erythematous, retracted or bulging.     Left Ear: Hearing, tympanic membrane, ear canal and external ear normal. Tympanic membrane is not erythematous, retracted or bulging.     Nose: No mucosal edema or rhinorrhea.      Right Sinus: No maxillary sinus tenderness or frontal sinus tenderness.     Left Sinus: No maxillary sinus tenderness or frontal sinus tenderness.     Mouth/Throat:     Pharynx: Uvula midline.  Eyes:     General: Lids are normal. Lids are everted, no foreign bodies appreciated.     Conjunctiva/sclera: Conjunctivae normal.     Pupils: Pupils are equal, round, and reactive to light.  Neck:     Thyroid: No thyroid mass or thyromegaly.     Vascular: No carotid bruit.     Trachea: Trachea normal.  Cardiovascular:     Rate and Rhythm: Normal rate and regular rhythm.     Pulses: Normal pulses.     Heart sounds: Normal heart sounds, S1 normal and S2 normal. No murmur heard.    No friction rub. No gallop.  Pulmonary:     Effort: Pulmonary effort is normal. No tachypnea or respiratory distress.     Breath sounds: Normal breath sounds. No decreased breath sounds, wheezing, rhonchi or rales.  Abdominal:     General: Bowel sounds are normal.     Palpations: Abdomen is soft.     Tenderness: There is no abdominal tenderness.  Musculoskeletal:     Cervical back: Normal range of motion and neck supple.     Thoracic back: Normal.     Lumbar back: Spasms and tenderness present. No bony tenderness. Decreased range of motion. Positive right straight leg raise test.  Skin:    General: Skin is warm and dry.     Findings: No rash.  Neurological:     Mental Status: She is alert.     Gait: Gait abnormal.     Comments: Antalgic gait, unable to sit easily, pain with lying flat  Psychiatric:        Mood and Affect: Mood is not anxious or depressed.        Speech: Speech normal.        Behavior: Behavior normal. Behavior is cooperative.        Thought Content: Thought content normal.        Judgment: Judgment normal.       Results for orders placed or performed in visit on 12/26/23  POCT Urinalysis Dipstick (Automated)   Collection Time: 12/26/23  9:53 AM  Result Value Ref Range   Color, UA yellow     Clarity, UA clear    Glucose, UA Negative Negative   Bilirubin, UA negative    Ketones, UA negative    Spec Grav, UA 1.025 1.010 - 1.025   Blood, UA negative    pH, UA 6.0 5.0 - 8.0   Protein, UA Negative Negative   Urobilinogen, UA  0.2 0.2 or 1.0 E.U./dL   Nitrite, UA negitve    Leukocytes, UA Negative Negative    Assessment and Plan  Acute right-sided low back pain with right-sided sciatica Assessment & Plan: Acute, symptoms consistent with right sciatica. Recommend treatment with prednisone taper, tramadol for breakthrough pain and muscle relaxant at night. She will use heat on low back and start gentle stretching as soon as able, can consider massage.  Given her fall recently we will evaluate with x-ray to make sure no new fracture, although this is not necessarily connected given fall occurred 4 weeks ago.  Return and ER precautions provided.  Orders: -     DG Lumbar Spine Complete; Future  Urinary incontinence, unspecified type Assessment & Plan: Chronic, her urinary incontinence is been ongoing for greater than 1 year.  This is most likely stress incontinence and not clearly worsened with recent back injury. Given she does report some pain radiating to right lower quadrant if she has changing or worsening symptoms in this area she will contact me.  Orders: -     POCT Urinalysis Dipstick (Automated)  Fall, initial encounter  Other orders -     predniSONE; Take 1 tablet (20 mg total) by mouth daily with breakfast.  Dispense: 15 tablet; Refill: 0 -     Cyclobenzaprine HCl; Take 1 tablet (5 mg total) by mouth 3 (three) times daily as needed for muscle spasms.  Dispense: 30 tablet; Refill: 1 -     traMADol HCl; Take 1 tablet (50 mg total) by mouth every 8 (eight) hours as needed for up to 5 days for moderate pain (pain score 4-6) or severe pain (pain score 7-10).  Dispense: 15 tablet; Refill: 0    No follow-ups on file.   Kerby Nora, MD

## 2023-12-26 NOTE — Assessment & Plan Note (Addendum)
 Acute, symptoms consistent with right sciatica. Recommend treatment with prednisone taper, tramadol for breakthrough pain and muscle relaxant at night. She will use heat on low back and start gentle stretching as soon as able, can consider massage.  Given her fall recently we will evaluate with x-ray to make sure no new fracture, although this is not necessarily connected given fall occurred 4 weeks ago.  Return and ER precautions provided.

## 2023-12-30 ENCOUNTER — Encounter: Payer: Self-pay | Admitting: Family Medicine

## 2023-12-31 ENCOUNTER — Encounter: Payer: Self-pay | Admitting: Family Medicine

## 2024-01-01 ENCOUNTER — Encounter: Payer: Self-pay | Admitting: Family Medicine

## 2024-01-02 ENCOUNTER — Ambulatory Visit: Admitting: Family Medicine

## 2024-01-02 ENCOUNTER — Encounter: Payer: Self-pay | Admitting: Family Medicine

## 2024-01-02 VITALS — BP 110/64 | HR 64 | Temp 97.6°F | Ht 64.65 in | Wt 156.5 lb

## 2024-01-02 DIAGNOSIS — M5441 Lumbago with sciatica, right side: Secondary | ICD-10-CM

## 2024-01-02 DIAGNOSIS — N393 Stress incontinence (female) (male): Secondary | ICD-10-CM | POA: Diagnosis not present

## 2024-01-02 DIAGNOSIS — K5909 Other constipation: Secondary | ICD-10-CM

## 2024-01-02 NOTE — Progress Notes (Signed)
 Patient ID: [name] [name], female    DOB: 04-Aug-1961, 63 y.o.   MRN: [id number]  This visit was conducted in person.  BP 110/64 (BP Location: Right Arm, Patient Position: Sitting, Cuff Size: Normal)   Pulse 64   Temp 97.6 F (36.4 C) (Temporal)   Ht 5' 4.65" (1.642 m)   Wt 156 lb 8 oz (71 kg)   LMP 09/04/2014   SpO2 99%   BMI 26.33 kg/m    CC:  Chief Complaint  Patient presents with   Constipation   Back Pain    Follow up from visit 12/26/2023    Subjective:   HPI: [name] [name] is a 63 y.o. female presenting on 01/02/2024 for Constipation and Back Pain (Follow up from visit 12/26/2023)   Seen 12/26/2023 for  right sided sciatica.Herby Abraham showed :  There is no acute fracture or subluxation of the lumbar spine. The vertebral body heights are maintained. Mild degenerative changes and spurring at L1-L2 and L2-L3. The visualized posterior elements are intact. The soft tissues are unremarkable. Large amount of stool throughout the colon. Treated with prednisone taper, tramadol and muscle relaxant  Has chronic constipation... usually pellets small in AM every day    Today she reports continued  constipation  Had tea laxative.. had BM after 12-15 hours.... had large BM Back has improved.. but still pressure in low back  Tried to increase fiber, miralax.. no BM. Used enema this AM had huge amount of stool come out.    Mom with chronic constiaptiona nd colon cancer  Last colonoscopy 09/2018.    Relevant past medical, surgical, family and social history reviewed and updated as indicated. Interim medical history since our last visit reviewed. Allergies and medications reviewed and updated. Outpatient Medications Prior to Visit  Medication Sig Dispense Refill   Cholecalciferol (VITAMIN D3) 1.25 MG (50000 UT) CAPS TAKE 1 CAPSULE BY MOUTH WEEKLY FOR 12 WEEKS 12 capsule 0   cyclobenzaprine (FLEXERIL) 5 MG tablet Take 1 tablet (5 mg total) by mouth 3 (three) times daily  as needed for muscle spasms. 30 tablet 1   ibuprofen (ADVIL) 800 MG tablet Take 1 tablet (800 mg total) by mouth every 8 (eight) hours as needed. 60 tablet 0   OVER THE COUNTER MEDICATION Viviscal: Hair Vitamin     predniSONE (DELTASONE) 20 MG tablet Take 1 tablet (20 mg total) by mouth daily with breakfast. 15 tablet 0   timolol (TIMOPTIC) 0.5 % ophthalmic solution Place 1 drop into the left eye daily.     No facility-administered medications prior to visit.     Per HPI unless specifically indicated in ROS section below Review of Systems  Constitutional:  Negative for fatigue and fever.  HENT:  Negative for congestion.   Eyes:  Negative for pain.  Respiratory:  Negative for cough and shortness of breath.   Cardiovascular:  Negative for chest pain, palpitations and leg swelling.  Gastrointestinal:  Negative for abdominal pain.  Genitourinary:  Negative for dysuria and vaginal bleeding.  Musculoskeletal:  Negative for back pain.  Neurological:  Negative for syncope, light-headedness and headaches.  Psychiatric/Behavioral:  Negative for dysphoric mood.    Objective:  BP 110/64 (BP Location: Right Arm, Patient Position: Sitting, Cuff Size: Normal)   Pulse 64   Temp 97.6 F (36.4 C) (Temporal)   Ht 5' 4.65" (1.642 m)   Wt 156 lb 8 oz (71 kg)   LMP 09/04/2014   SpO2 99%   BMI  26.33 kg/m   Wt Readings from Last 3 Encounters:  01/02/24 156 lb 8 oz (71 kg)  12/26/23 155 lb 6 oz (70.5 kg)  09/25/23 155 lb (70.3 kg)      Physical Exam Constitutional:      General: She is not in acute distress.    Appearance: Normal appearance. She is well-developed. She is not ill-appearing or toxic-appearing.  HENT:     Head: Normocephalic.     Right Ear: Hearing, tympanic membrane, ear canal and external ear normal. Tympanic membrane is not erythematous, retracted or bulging.     Left Ear: Hearing, tympanic membrane, ear canal and external ear normal. Tympanic membrane is not erythematous,  retracted or bulging.     Nose: No mucosal edema or rhinorrhea.     Right Sinus: No maxillary sinus tenderness or frontal sinus tenderness.     Left Sinus: No maxillary sinus tenderness or frontal sinus tenderness.     Mouth/Throat:     Pharynx: Uvula midline.  Eyes:     General: Lids are normal. Lids are everted, no foreign bodies appreciated.     Conjunctiva/sclera: Conjunctivae normal.     Pupils: Pupils are equal, round, and reactive to light.  Neck:     Thyroid: No thyroid mass or thyromegaly.     Vascular: No carotid bruit.     Trachea: Trachea normal.  Cardiovascular:     Rate and Rhythm: Normal rate and regular rhythm.     Pulses: Normal pulses.     Heart sounds: Normal heart sounds, S1 normal and S2 normal. No murmur heard.    No friction rub. No gallop.  Pulmonary:     Effort: Pulmonary effort is normal. No tachypnea or respiratory distress.     Breath sounds: Normal breath sounds. No decreased breath sounds, wheezing, rhonchi or rales.  Abdominal:     General: Bowel sounds are normal.     Palpations: Abdomen is soft.     Tenderness: There is no abdominal tenderness.  Musculoskeletal:     Cervical back: Normal range of motion and neck supple.  Skin:    General: Skin is warm and dry.     Findings: No rash.  Neurological:     Mental Status: She is alert.  Psychiatric:        Mood and Affect: Mood is not anxious or depressed.        Speech: Speech normal.        Behavior: Behavior normal. Behavior is cooperative.        Thought Content: Thought content normal.        Judgment: Judgment normal.       Results for orders placed or performed in visit on 12/26/23  POCT Urinalysis Dipstick (Automated)   Collection Time: 12/26/23  9:53 AM  Result Value Ref Range   Color, UA yellow    Clarity, UA clear    Glucose, UA Negative Negative   Bilirubin, UA negative    Ketones, UA negative    Spec Grav, UA 1.025 1.010 - 1.025   Blood, UA negative    pH, UA 6.0 5.0 - 8.0    Protein, UA Negative Negative   Urobilinogen, UA 0.2 0.2 or 1.0 E.U./dL   Nitrite, UA negitve    Leukocytes, UA Negative Negative    Assessment and Plan  Acute right-sided low back pain with right-sided sciatica Assessment & Plan: Acute, significant improvement status post prednisone and home physical therapy.  Using tramadol and muscle relaxant  as needed.   Chronic constipation Assessment & Plan: Chronic, constipation possibly worsened secondary to low back pain. She has tried to increase fiber and MiraLAX without bowel movement she did use an enema this morning and had a huge amount of stool come out.  Recommend continuing MiraLAX 1-2 times daily. She will call if symptoms not improving as expected for possible consideration of trial of Linzess or Amitiza.   Stress incontinence Assessment & Plan: Chronic, possibly related to constipation obstructing outflow resulting on overflow incontinence.  If not improving as expected discussed referral to urology for further evaluation and treatment.     No follow-ups on file.   Kerby Nora, MD

## 2024-01-13 ENCOUNTER — Encounter: Payer: Self-pay | Admitting: Family Medicine

## 2024-01-14 ENCOUNTER — Other Ambulatory Visit: Payer: Self-pay | Admitting: Family Medicine

## 2024-01-15 DIAGNOSIS — K5909 Other constipation: Secondary | ICD-10-CM | POA: Insufficient documentation

## 2024-01-15 DIAGNOSIS — N393 Stress incontinence (female) (male): Secondary | ICD-10-CM | POA: Insufficient documentation

## 2024-01-15 NOTE — Assessment & Plan Note (Signed)
 Chronic, constipation possibly worsened secondary to low back pain. She has tried to increase fiber and MiraLAX without bowel movement she did use an enema this morning and had a huge amount of stool come out.  Recommend continuing MiraLAX 1-2 times daily. She will call if symptoms not improving as expected for possible consideration of trial of Linzess or Amitiza.

## 2024-01-15 NOTE — Assessment & Plan Note (Signed)
 Chronic, possibly related to constipation obstructing outflow resulting on overflow incontinence.  If not improving as expected discussed referral to urology for further evaluation and treatment.

## 2024-01-15 NOTE — Assessment & Plan Note (Signed)
 Acute, significant improvement status post prednisone and home physical therapy.  Using tramadol and muscle relaxant as needed.

## 2024-02-19 ENCOUNTER — Encounter: Payer: Self-pay | Admitting: Family Medicine

## 2024-03-05 ENCOUNTER — Other Ambulatory Visit: Payer: Self-pay | Admitting: Family Medicine

## 2024-03-06 NOTE — Telephone Encounter (Signed)
 Last office visit 12/13/2023 for constipation and back pain.  Last refilled 12/11/2023 for #12 with no refills.  Vit D level 05/14/2023 low at 22.61 ng/mL.  Next Appt: No future appointments.

## 2024-07-24 ENCOUNTER — Encounter: Payer: Self-pay | Admitting: Physician Assistant

## 2024-09-10 NOTE — Progress Notes (Signed)
 09/11/2024 [name] [id number] Feb 23, 1961  Referring provider: Avelina Greig FORBES, MD Primary GI doctor: Dr. Federico (Dr. Aneita)  ASSESSMENT AND PLAN:  Constipation, life long, no hematochezia, AB pain, bloating 12/26/2023 lumbar spine x-ray large amount of stool throughout the colon Started on miralax this year, Soft stools and improved BM's Continue miralax, add benefiber, consider linzess Possible pelvic floor component, consider pelvic floor PT  Personal history of tubular adenomatous polyps Mother with colon cancer 09/16/2017 colonoscopy excellent prep internal hemorrhoids grade 1 otherwise unremarkable recall 5 years Schedule colonoscopy at [name], We have discussed the risks of bleeding, infection, perforation, medication reactions, and remote risk of death associated with colonoscopy. All questions were answered and the patient acknowledges these risk and wishes to proceed.    Patient Care Team: [name] Greig FORBES, MD as PCP - General [name] Ned, MD as Consulting Physician (Obstetrics and Gynecology)  HISTORY OF PRESENT ILLNESS: 63 y.o. female with a past medical history listed below presents for evaluation of screening colonoscopy.   Discussed the use of AI scribe software for clinical note transcription with the patient, who gave verbal consent to proceed.  History of Present Illness   [name] is a 63 year old female who presents for evaluation of bowel habits and urinary incontinence.  She has a lifelong history of constipation, which has remained consistent over the years. She has been taking Miralax daily for the past year, mixed into her coffee every morning, resulting in softer stools and improved bowel regularity. Prior to starting Miralax, she experienced hard stools, occasional blood in the stool, and significant bloating. An x-ray in March revealed a large amount of stool. She denies any upper gastrointestinal symptoms such as heartburn, nausea, or  difficulty swallowing. She also denies any pain but reports a sensation of fullness.  She reports urinary incontinence for two to three years, with urine leakage especially when walking, necessitating the use of a daily pad. She has not undergone pelvic floor physical therapy and has not had any urinary tract infections. She associates some of her symptoms with her history of sciatica, which has improved with physical therapy.  She has undergone previous colonoscopies, with the most recent one in 2018 showing no polyps, although earlier colonoscopies did reveal polyps.        She  reports that she quit smoking about 12 years ago. Her smoking use included cigarettes. She started smoking about 37 years ago. She has a 5 pack-year smoking history. She has never used smokeless tobacco. She reports current alcohol use. She reports that she does not use drugs.  RELEVANT GI HISTORY, IMAGING AND LABS: Results   RADIOLOGY Abdominal X-ray: Significant fecal retention (12/2023)      05/2022 unremarkable echocardiogram stress test CBC    Component Value Date/Time   WBC 5.3 10/22/2023 0922   RBC 4.36 10/22/2023 0922   HGB 12.8 10/22/2023 0922   HCT 38.6 10/22/2023 0922   PLT 264.0 10/22/2023 0922   MCV 88.5 10/22/2023 0922   MCH 29.3 09/25/2023 1205   MCHC 33.3 10/22/2023 0922   RDW 13.8 10/22/2023 0922   LYMPHSABS 1.8 10/22/2023 0922   MONOABS 0.4 10/22/2023 0922   EOSABS 0.1 10/22/2023 0922   BASOSABS 0.0 10/22/2023 0922   Recent Labs    09/25/23 1205 10/22/23 0922  HGB 13.2 12.8    CMP     Component Value Date/Time   NA 139 10/22/2023 0922   K 3.8 10/22/2023 9077  CL 104 10/22/2023 0922   CO2 27 10/22/2023 0922   GLUCOSE 95 10/22/2023 0922   BUN 17 10/22/2023 0922   CREATININE 0.90 10/22/2023 0922   CALCIUM 9.8 10/22/2023 0922   PROT 7.5 10/22/2023 0922   ALBUMIN 4.5 10/22/2023 0922   AST 21 10/22/2023 0922   ALT 16 10/22/2023 0922   ALKPHOS 89 10/22/2023 0922   BILITOT  0.6 10/22/2023 0922   GFRNONAA >60 09/25/2023 1205   GFRAA 99 04/30/2008 0856      Latest Ref Rng & Units 10/22/2023    9:22 AM 04/12/2023   12:34 PM 09/19/2022   11:32 AM  Hepatic Function  Total Protein 6.0 - 8.3 g/dL 7.5  7.3  7.3   Albumin 3.5 - 5.2 g/dL 4.5  4.5  4.4   AST 0 - 37 U/L 21  24  31    ALT 0 - 35 U/L 16  18  30    Alk Phosphatase 39 - 117 U/L 89  80  78   Total Bilirubin 0.2 - 1.2 mg/dL 0.6  0.7  0.6       Current Medications:   Current Outpatient Medications (Endocrine & Metabolic):    predniSONE  (DELTASONE ) 20 MG tablet, Take 1 tablet (20 mg total) by mouth daily with breakfast. (Patient not taking: Reported on 09/11/2024)    Current Outpatient Medications (Analgesics):    ibuprofen  (ADVIL ) 800 MG tablet, Take 1 tablet (800 mg total) by mouth every 8 (eight) hours as needed. (Patient not taking: Reported on 09/11/2024)   Current Outpatient Medications (Other):    Na Sulfate-K Sulfate-Mg Sulfate concentrate (SUPREP BOWEL PREP KIT) 17.5-3.13-1.6 GM/177ML SOLN, Take 1 kit (354 mLs total) by mouth as directed.   timolol (TIMOPTIC) 0.5 % ophthalmic solution, Place 1 drop into the left eye daily.   Cholecalciferol (VITAMIN D3) 1.25 MG (50000 UT) CAPS, TAKE 1 CAPSULE BY MOUTH WEEKLY FOR 12 WEEKS (Patient not taking: Reported on 09/11/2024)   cyclobenzaprine  (FLEXERIL ) 5 MG tablet, Take 1 tablet (5 mg total) by mouth 3 (three) times daily as needed for muscle spasms. 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 Family History:  Her family history includes COPD in her father; Cancer in her mother; Colon cancer in her maternal aunt and mother; Heart disease in her father, sister, and sister; Hypertension in her mother; Stroke in her sister and sister.  REVIEW OF SYSTEMS  : All other systems reviewed and negative except where noted in the History of Present Illness.  PHYSICAL EXAM: BP 110/72   Pulse 80   Ht 5' 5 (1.651 m)   Wt 155 lb (70.3 kg)   LMP 09/04/2014   SpO2 99%   BMI 25.79 kg/m  Physical Exam   GENERAL APPEARANCE: Well nourished, in no apparent distress. HEENT: No cervical lymphadenopathy, unremarkable thyroid , sclerae anicteric, conjunctiva pink. RESPIRATORY: Respiratory effort normal, breath sounds equal bilaterally without rales, rhonchi, or wheezing. CARDIO: Regular rate and rhythm with no murmurs, rubs, or gallops, peripheral pulses intact. ABDOMEN: Soft, non-distended, active bowel sounds in all four quadrants, no tenderness to palpation, no rebound, no mass appreciated. RECTAL: Declines examination. MUSCULOSKELETAL: Full range of motion, normal gait, without edema. SKIN: Dry, intact without rashes or lesions. No jaundice. NEURO: Alert, oriented, no focal deficits. PSYCH: Cooperative, normal mood and affect.      Alan JONELLE Coombs, PA-C 10:58 AM

## 2024-09-11 ENCOUNTER — Ambulatory Visit: Admitting: Physician Assistant

## 2024-09-11 ENCOUNTER — Encounter: Payer: Self-pay | Admitting: Physician Assistant

## 2024-09-11 DIAGNOSIS — K5909 Other constipation: Secondary | ICD-10-CM

## 2024-09-11 DIAGNOSIS — Z860101 Personal history of adenomatous and serrated colon polyps: Secondary | ICD-10-CM

## 2024-09-11 MED ORDER — NA SULFATE-K SULFATE-MG SULF 17.5-3.13-1.6 GM/177ML PO SOLN: 1.0000 | ORAL | 0 refills | Status: AC

## 2024-09-11 NOTE — Patient Instructions (Addendum)
 _______________________________________________________  If your blood pressure at your visit was 140/90 or greater, please contact your primary care physician to follow up on this.  _______________________________________________________  If you are age 62 or older, your body mass index should be between 23-30. Your Body mass index is 25.79 kg/m. If this is out of the aforementioned range listed, please consider follow up with your Primary Care Provider.  If you are age 51 or younger, your body mass index should be between 19-25. Your Body mass index is 25.79 kg/m. If this is out of the aformentioned range listed, please consider follow up with your Primary Care Provider.   ________________________________________________________  The [facility] providers would like to encourage you to use MYCHART to communicate with providers for non-urgent requests or questions.  Due to long hold times on the telephone, sending your provider a message by [place] may be a faster and more efficient way to get a response.  Please allow 48 business hours for a response.  Please remember that this is for non-urgent requests.  _______________________________________________________  [facility] is using a team-based approach to care.  Your team is made up of your doctor and two to three APPS. Our APPS (Nurse Practitioners and Physician Assistants) work with your physician to ensure care continuity for you. They are fully qualified to address your health concerns and develop a treatment plan. They communicate directly with your gastroenterologist to care for you. Seeing the Advanced Practice Practitioners on your physician's team can help you by facilitating care more promptly, often allowing for earlier appointments, access to diagnostic testing, procedures, and other specialty referrals.   You have been scheduled for a colonoscopy. Please follow written instructions given to you at your visit today.    If you use inhalers (even only as needed), please bring them with you on the day of your procedure.  DO NOT TAKE 7 DAYS PRIOR TO TEST- Trulicity (dulaglutide) Ozempic, Wegovy (semaglutide) Mounjaro, Zepbound (tirzepatide) Bydureon Bcise (exanatide extended release)  DO NOT TAKE 1 DAY PRIOR TO YOUR TEST Rybelsus (semaglutide) Adlyxin (lixisenatide) Victoza (liraglutide) Byetta (exanatide) ___________________________________________________________________________  Due to recent changes in healthcare laws, you may see the results of your imaging and laboratory studies on MyChart before your provider has had a chance to review them.  We understand that in some cases there may be results that are confusing or concerning to you. Not all laboratory results come back in the same time frame and the provider may be waiting for multiple results in order to interpret others.  Please give us  48 hours in order for your provider to thoroughly review all the results before contacting the office for clarification of your results.    Toileting tips to help with your constipation - Drink at least 64-80 ounces of water/liquid per day. - Establish a time to try to move your bowels every day.  For many people, this is after a cup of coffee or after a meal such as breakfast. - Sit all of the way back on the toilet keeping your back fairly straight and while sitting up, try to rest the tops of your forearms on your upper thighs.   - Raising your feet with a step stool/squatty potty can be helpful to improve the angle that allows your stool to pass through the rectum. - Relax the rectum feeling it bulge toward the toilet water.  If you feel your rectum raising toward your body, you are contracting rather than relaxing. - Breathe in and slowly  exhale. Belly breath by expanding your belly towards your belly button. Keep belly expanded as you gently direct pressure down and back to the anus.  A low pitched GRRR  sound can assist with increasing intra-abdominal pressure.  (Can also trying to blow on a pinwheel and make it move, this helps with the same belly breathing) - Repeat 3-4 times. If unsuccessful, contract the pelvic floor to restore normal tone and get off the toilet.  Avoid excessive straining. - To reduce excessive wiping by teaching your anus to normally contract, place hands on outer aspect of knees and resist knee movement outward.  Hold 5-10 second then place hands just inside of knees and resist inward movement of knees.  Hold 5 seconds.  Repeat a few times each way.  Go to the ER if unable to pass gas, severe AB pain, unable to hold down food, any shortness of breath of chest pain.   Miralax is an osmotic laxative.  It only brings more water into the stool.  This is safe to take daily.  Can take up to 17 gram of miralax twice a day.  Mix with juice or coffee.  Start 1 capful at night for 3-4 days and reassess your response in 3-4 days.  You can increase and decrease the dose based on your response.  Remember, it can take up to 3-4 days to take effect OR for the effects to wear off.   I often pair this with benefiber in the morning to help assure the stool is not too loose.   FIBER SUPPLEMENT You can do metamucil or fibercon once or twice a day but if this causes gas/bloating please switch to Benefiber or Citracel.  Fiber is good for constipation/diarrhea/irritable bowel syndrome.  It can also help with weight loss and can help lower your bad cholesterol (LDL).  Please do 1 TBSP in the morning in water, coffee, or tea.  It can take up to a month before you can see a difference with your bowel movements.  It is cheapest from [employer], [employer], [employer].   Here some information about pelvic floor dysfunction. This may be contributing to some of your symptoms. We will continue with our evaluation but I do want you to consider adding on fiber supplement with low-dose MiraLAX daily. We  could also refer to pelvic floor physical therapy.   Pelvic Floor Dysfunction, Female Pelvic floor dysfunction (PFD) is a condition that results when the group of muscles and connective tissues that support the organs in the pelvis (pelvic floor muscles) do not work well. These muscles and their connections form a sling that supports the colon and bladder. In women, they also support the uterus. PFD causes pelvic floor muscles to be too weak, too tight, or both. In PFD, muscle movements are not coordinated. This may cause bowel or bladder problems. It may also cause pain. What are the causes? This condition may be caused by an injury to the pelvic area or by a weakening of pelvic muscles. This often results from pregnancy and childbirth or other types of strain. In many cases, the exact cause is not known. What increases the risk? The following factors may make you more likely to develop this condition: Having chronic bladder tissue inflammation (interstitial cystitis). Being an older person. Being overweight. History of radiation treatment for cancer in the pelvic region. Previous pelvic surgery, such as removal of the uterus (hysterectomy). What are the signs or symptoms? Symptoms of this condition vary and may  include: Bladder symptoms, such as: Trouble starting urination and emptying the bladder. Frequent urinary tract infections. Leaking urine when coughing, laughing, or exercising (stress incontinence). Having to pass urine urgently or frequently. Pain when passing urine. Bowel symptoms, such as: Constipation. Urgent or frequent bowel movements. Incomplete bowel movements. Painful bowel movements. Leaking stool or gas. Unexplained genital or rectal pain. Genital or rectal muscle spasms. Low back pain. Other symptoms may include: A heavy, full, or aching feeling in the vagina. A bulge that protrudes into the vagina. Pain during or after sex. How is this diagnosed? This  condition may be diagnosed based on: Your symptoms and medical history. A physical exam. During the exam, your health care provider may check your pelvic muscles for tightness, spasm, pain, or weakness. This may include a rectal exam and a pelvic exam. In some cases, you may have diagnostic tests, such as: Electrical muscle function tests. Urine flow testing. X-ray tests of bowel function. Ultrasound of the pelvic organs. How is this treated? Treatment for this condition depends on the symptoms. Treatment options include: Physical therapy. This may include Kegel exercises to help relax or strengthen the pelvic floor muscles. Biofeedback. This type of therapy provides feedback on how tight your pelvic floor muscles are so that you can learn to control them. Internal or external massage therapy. A treatment that involves electrical stimulation of the pelvic floor muscles to help control pain (transcutaneous electrical nerve stimulation, or TENS). Sound wave therapy (ultrasound) to reduce muscle spasms. Medicines, such as: Muscle relaxants. Bladder control medicines. Surgery to reconstruct or support pelvic floor muscles may be an option if other treatments do not help. Follow these instructions at home: Activity Do your usual activities as told by your health care provider. Ask your health care provider if you should modify any activities. Do pelvic floor strengthening or relaxing exercises at home as told by your physical therapist. Lifestyle Maintain a healthy weight. Eat foods that are high in fiber, such as beans, whole grains, and fresh fruits and vegetables. Limit foods that are high in fat and processed sugars, such as fried or sweet foods. Manage stress with relaxation techniques such as yoga or meditation. General instructions If you have problems with leakage: Use absorbable pads or wear padded underwear. Wash frequently with mild soap. Keep your genital and anal area as clean  and dry as possible. Ask your health care provider if you should try a barrier cream to prevent skin irritation. Take warm baths to relieve pelvic muscle tension or spasms. Take over-the-counter and prescription medicines only as told by your health care provider. Keep all follow-up visits. How is this prevented? The cause of PFD is not always known, but there are a few things you can do to reduce the risk of developing this condition, including: Staying at a healthy weight. Getting regular exercise. Managing stress. Contact a health care provider if: Your symptoms are not improving with home care. You have signs or symptoms of PFD that get worse at home. You develop new signs or symptoms. You have signs of a urinary tract infection, such as: Fever. Chills. Increased urinary frequency. A burning feeling when urinating. You have not had a bowel movement in 3 days (constipation). Summary Pelvic floor dysfunction results when the muscles and connective tissues in your pelvic floor do not work well. These muscles and their connections form a sling that supports your colon and bladder. In women, they also support the uterus. PFD may be caused by an injury  to the pelvic area or by a weakening of pelvic muscles. PFD causes pelvic floor muscles to be too weak, too tight, or a combination of both. Symptoms may vary from person to person. In most cases, PFD can be treated with physical therapies and medicines. Surgery may be an option if other treatments do not help. This information is not intended to replace advice given to you by your health care provider. Make sure you discuss any questions you have with your health care provider. Document Revised: 02/01/2021 Document Reviewed: 02/01/2021 Elsevier Patient Education  2022 [employer].

## 2024-10-12 ENCOUNTER — Telehealth: Payer: Self-pay | Admitting: Internal Medicine

## 2024-10-12 NOTE — Telephone Encounter (Signed)
 Good morning Dr. Federico,   Patient requested to reschedule 1/8 colonoscopy due to a family emergency and having to leave town. Patient has been rescheduled for 2/27.   Thank you

## 2024-10-15 ENCOUNTER — Encounter: Admitting: Internal Medicine

## 2024-11-03 ENCOUNTER — Telehealth (INDEPENDENT_AMBULATORY_CARE_PROVIDER_SITE_OTHER)

## 2024-11-03 ENCOUNTER — Encounter: Payer: Self-pay | Admitting: Family Medicine

## 2024-11-03 VITALS — BP 115/70 | Ht 65.0 in | Wt 150.0 lb

## 2024-11-03 DIAGNOSIS — B9689 Other specified bacterial agents as the cause of diseases classified elsewhere: Secondary | ICD-10-CM

## 2024-11-03 DIAGNOSIS — J329 Chronic sinusitis, unspecified: Secondary | ICD-10-CM

## 2024-11-03 MED ORDER — AMOXICILLIN-POT CLAVULANATE 875-125 MG PO TABS: 1.0000 | ORAL_TABLET | Freq: Two times a day (BID) | ORAL | 0 refills | Status: AC

## 2024-11-03 MED ORDER — OXYMETAZOLINE HCL 0.05 % NA SOLN: 1.0000 | Freq: Two times a day (BID) | NASAL | 0 refills | Status: AC

## 2024-11-03 NOTE — Progress Notes (Unsigned)
 "  Ph: [phone] Fax: [phone]   Patient ID: [name] [name], female    DOB: 08-28-1961, 64 y.o.   MRN: [id number]  Virtual visit completed through MyChart, a video enabled telemedicine application. Reviewed limitations, risks, security and privacy concerns of performing a virtual visit and the availability of in person appointments. I also reviewed that there may be a patient responsible charge related to this service. The patient agreed to proceed.  The patient gave informed consent to the use of [employer] AI technology to record the contents of the encounter as documented below.  Patient location: home Provider location: [place] [facility], office Persons participating in this virtual visit: patient, provider Reuben MARLA Burkes, MD   If any vitals were documented, they were collected by patient at home unless specified below.    BP 115/70   Ht 5' 5 (1.651 m)   Wt 150 lb (68 kg)   LMP 09/04/2014   BMI 24.96 kg/m    CC: Sinus infection  Subjective:   HPI: [name] [name] is a 64 y.o. female presenting on 11/03/2024 for Respiratory Illness (Started about 10 days ago. Went to an UC in [place]  10-26-24 and was diagnosed with Influenza A. She was put on Tamiflu. Has nasal congestion, left side nasal pain and pressure, headache. )   Discussed the use of AI scribe software for clinical note transcription with the patient, who gave verbal consent to proceed.  History of Present Illness Says she has been having nasal congestion since last Tuesday, feels pressure on her left eye and in the left side of her face, endorses frontal headache. Send she was diagnosed with influenza A at [facility] on 1/19 and completed a course of Tamiflu but the above symptoms have persisted.  Says it is also painful whenever she blows her nose and she did bleed a little bit. Denies any fever or chills          Relevant past medical, surgical, family and social history reviewed and updated as  indicated. Interim medical history since our last visit reviewed. Allergies and medications reviewed and updated. Outpatient Medications Prior to Visit  Medication Sig Dispense Refill   ibuprofen  (ADVIL ) 800 MG tablet Take 1 tablet (800 mg total) by mouth every 8 (eight) hours as needed. 60 tablet 0   timolol (TIMOPTIC) 0.5 % ophthalmic solution Place 1 drop into the left eye daily.     Na Sulfate-K Sulfate-Mg Sulfate concentrate (SUPREP BOWEL PREP KIT) 17.5-3.13-1.6 GM/177ML SOLN Take 1 kit (354 mLs total) by mouth as directed. (Patient not taking: Reported on 11/03/2024) 324 mL 0   Cholecalciferol (VITAMIN D3) 1.25 MG (50000 UT) CAPS TAKE 1 CAPSULE BY MOUTH WEEKLY FOR 12 WEEKS (Patient not taking: Reported on 09/11/2024) 12 capsule 0   cyclobenzaprine  (FLEXERIL ) 5 MG tablet Take 1 tablet (5 mg total) by mouth 3 (three) times daily as needed for muscle spasms. (Patient not taking: Reported on 09/11/2024) 30 tablet 1   OVER THE COUNTER MEDICATION Viviscal: Hair Vitamin (Patient not taking: Reported on 09/11/2024)     predniSONE  (DELTASONE ) 20 MG tablet Take 1 tablet (20 mg total) by mouth daily with breakfast. (Patient not taking: Reported on 09/11/2024) 15 tablet 0   No facility-administered medications prior to visit.     Per HPI unless specifically indicated in ROS section below Review of Systems  All other systems reviewed and are negative.   Objective:   BP 115/70   Ht 5' 5 (1.651 m)  Wt 150 lb (68 kg)   LMP 09/04/2014   BMI 24.96 kg/m   Wt Readings from Last 3 Encounters:  11/03/24 150 lb (68 kg)  09/11/24 155 lb (70.3 kg)  01/02/24 156 lb 8 oz (71 kg)       Physical exam: Gen: alert, NAD, not ill appearing Pulm: speaks in complete sentences without increased work of breathing Psych: normal mood, normal thought content        Results for orders placed or performed in visit on 12/26/23  POCT Urinalysis Dipstick (Automated)   Collection Time: 12/26/23  9:53 AM  Result  Value Ref Range   Color, UA yellow    Clarity, UA clear    Glucose, UA Negative Negative   Bilirubin, UA negative    Ketones, UA negative    Spec Grav, UA 1.025 1.010 - 1.025   Blood, UA negative    pH, UA 6.0 5.0 - 8.0   Protein, UA Negative Negative   Urobilinogen, UA 0.2 0.2 or 1.0 E.U./dL   Nitrite, UA negitve    Leukocytes, UA Negative Negative     Assessment & Plan:    Assessment & Plan Bacterial sinusitis Nasal congestion with left-sided frontal headache and pressure, given persistence of symptoms for more than a week we will treat with antibiotics.  Will add Afrin for symptomatic relief, counseled to blow her nose with water rather than tissues to minimize mechanical trauma to the nostrils. Of note, there was documented history of allergy to Augmentin  (reportedly caused nausea and vomiting), however, patient is requesting this be removed as she tolerated it in the past and is agreeable to take it again, chart updated accordingly.  Orders:   oxymetazoline  (AFRIN 12 HOUR) 0.05 % nasal spray; Place 1 spray into both nostrils 2 (two) times daily for 3 days. USE ONLY FOR 3 DAYS   amoxicillin -clavulanate (AUGMENTIN ) 875-125 MG tablet; Take 1 tablet by mouth 2 (two) times daily for 5 days.    I discussed the assessment and treatment plan with the patient. The patient was provided an opportunity to ask questions and all were answered. The patient agreed with the plan and demonstrated an understanding of the instructions. The patient was advised to call back or seek an in-person evaluation if the symptoms worsen or if the condition fails to improve as anticipated.  No follow-ups on file.  Kennon Encinas K Elbie Statzer, MD  11/03/24     Contains text generated by [employer].    "

## 2024-11-10 ENCOUNTER — Encounter: Payer: Self-pay | Admitting: Family Medicine

## 2024-12-04 ENCOUNTER — Encounter: Admitting: Internal Medicine
